# Patient Record
Sex: Female | Born: 1980 | Race: White | Hispanic: No | Marital: Single | Smoking: Former smoker
Health system: Southern US, Community
[De-identification: ages and names within clinical notes are randomized; demographics above are authoritative.]

## PROBLEM LIST (undated history)

## (undated) ENCOUNTER — Inpatient Hospital Stay: Payer: Self-pay

## (undated) DIAGNOSIS — K5900 Constipation, unspecified: Secondary | ICD-10-CM

## (undated) DIAGNOSIS — U071 COVID-19: Secondary | ICD-10-CM

## (undated) DIAGNOSIS — F419 Anxiety disorder, unspecified: Secondary | ICD-10-CM

## (undated) DIAGNOSIS — N83209 Unspecified ovarian cyst, unspecified side: Secondary | ICD-10-CM

## (undated) DIAGNOSIS — B019 Varicella without complication: Secondary | ICD-10-CM

## (undated) DIAGNOSIS — M21612 Bunion of left foot: Secondary | ICD-10-CM

## (undated) DIAGNOSIS — R87619 Unspecified abnormal cytological findings in specimens from cervix uteri: Secondary | ICD-10-CM

## (undated) DIAGNOSIS — A6000 Herpesviral infection of urogenital system, unspecified: Secondary | ICD-10-CM

## (undated) DIAGNOSIS — S73192A Other sprain of left hip, initial encounter: Secondary | ICD-10-CM

## (undated) DIAGNOSIS — F4310 Post-traumatic stress disorder, unspecified: Secondary | ICD-10-CM

## (undated) DIAGNOSIS — D369 Benign neoplasm, unspecified site: Secondary | ICD-10-CM

## (undated) HISTORY — DX: Constipation, unspecified: K59.00

## (undated) HISTORY — DX: Herpesviral infection of urogenital system, unspecified: A60.00

## (undated) HISTORY — DX: Benign neoplasm, unspecified site: D36.9

## (undated) HISTORY — DX: Bunion of left foot: M21.612

## (undated) HISTORY — DX: Other sprain of left hip, initial encounter: S73.192A

## (undated) HISTORY — DX: Anxiety disorder, unspecified: F41.9

## (undated) HISTORY — DX: COVID-19: U07.1

## (undated) HISTORY — DX: Unspecified abnormal cytological findings in specimens from cervix uteri: R87.619

## (undated) HISTORY — DX: Unspecified ovarian cyst, unspecified side: N83.209

## (undated) HISTORY — DX: Varicella without complication: B01.9

## (undated) HISTORY — DX: Post-traumatic stress disorder, unspecified: F43.10

---
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
## 2005-01-08 ENCOUNTER — Ambulatory Visit (HOSPITAL_COMMUNITY): Admission: RE | Admit: 2005-01-08 | Discharge: 2005-01-08 | Payer: Self-pay | Admitting: Gynecology

## 2005-05-29 ENCOUNTER — Inpatient Hospital Stay (HOSPITAL_COMMUNITY): Admission: AD | Admit: 2005-05-29 | Discharge: 2005-05-31 | Payer: Self-pay | Admitting: Gynecology

## 2007-11-06 ENCOUNTER — Ambulatory Visit: Payer: Self-pay

## 2008-04-23 ENCOUNTER — Emergency Department: Payer: Self-pay | Admitting: Emergency Medicine

## 2008-04-23 ENCOUNTER — Other Ambulatory Visit: Payer: Self-pay

## 2011-01-19 ENCOUNTER — Encounter: Payer: Self-pay | Admitting: Gynecology

## 2013-04-07 ENCOUNTER — Ambulatory Visit (INDEPENDENT_AMBULATORY_CARE_PROVIDER_SITE_OTHER): Payer: 59 | Admitting: Obstetrics & Gynecology

## 2013-04-07 ENCOUNTER — Encounter: Payer: Self-pay | Admitting: Obstetrics & Gynecology

## 2013-04-07 VITALS — BP 118/77 | HR 99 | Temp 98.2°F | Ht 66.0 in | Wt 140.0 lb

## 2013-04-07 DIAGNOSIS — Z3202 Encounter for pregnancy test, result negative: Secondary | ICD-10-CM

## 2013-04-07 DIAGNOSIS — N760 Acute vaginitis: Secondary | ICD-10-CM

## 2013-04-07 DIAGNOSIS — R87612 Low grade squamous intraepithelial lesion on cytologic smear of cervix (LGSIL): Secondary | ICD-10-CM | POA: Insufficient documentation

## 2013-04-07 LAB — POCT WET PREP (WET MOUNT)

## 2013-04-07 NOTE — Progress Notes (Signed)
Subjective:     Patient ID: Suzanne Evans, female   DOB: 1981/10/16, 32 y.o.   MRN: [id number]  HPI See colposcopy note  Review of Systems See colposcopy note    Objective:   Physical Exam  Genitourinary:         Assessment:     See colposcopy note    Plan:        See colposcopy note

## 2013-04-07 NOTE — Patient Instructions (Signed)

## 2013-04-07 NOTE — Progress Notes (Signed)
.  Colposcopy Procedure Note  Indications: Pap smear 3 months ago showed: low-grade squamous intraepithelial neoplasia (LGSIL - encompassing HPV,mild dysplasia,CIN I). The prior pap showed no abnormalities (09/2011 - per patient).  Prior cervical/vaginal disease: normal exam without visible pathology. Prior cervical treatment: no treatment. Patient had an IUD inserted 12/2012.  Procedure Details  The risks and benefits of the procedure and Written informed consent obtained.  A time-out was performed confirming the patient, procedure and allergy status  Speculum placed in vagina and excellent visualization of cervix achieved, cervix swabbed x 3 with acetic acid solution.  Findings: Cervix: acetowhite lesion(s) noted at 11 o'clock and punctation noted at 1 o'clock; hemostasis achieved with silver nitrate.       Specimens: Cervical biospsies/ECC  Complications: none.  Plan: Review biopsy result/likely repeat Pap w/HPV co-test in 1 yr

## 2013-04-08 ENCOUNTER — Encounter: Payer: Self-pay | Admitting: Obstetrics & Gynecology

## 2013-04-12 ENCOUNTER — Telehealth: Payer: Self-pay | Admitting: *Deleted

## 2013-04-12 NOTE — Telephone Encounter (Signed)
Patient called for results from Colposcopy that was done in office on 04/07/13.  Attempted to call back patient - voice mail not set up.

## 2013-05-04 ENCOUNTER — Encounter: Payer: Self-pay | Admitting: Obstetrics & Gynecology

## 2013-05-04 DIAGNOSIS — N870 Mild cervical dysplasia: Secondary | ICD-10-CM | POA: Insufficient documentation

## 2013-05-11 ENCOUNTER — Encounter: Payer: Self-pay | Admitting: Obstetrics & Gynecology

## 2013-06-03 ENCOUNTER — Ambulatory Visit (INDEPENDENT_AMBULATORY_CARE_PROVIDER_SITE_OTHER): Payer: 59 | Admitting: Obstetrics & Gynecology

## 2013-06-03 ENCOUNTER — Other Ambulatory Visit: Payer: Self-pay | Admitting: Obstetrics & Gynecology

## 2013-06-03 VITALS — BP 131/81 | HR 92 | Temp 99.5°F

## 2013-06-03 DIAGNOSIS — N926 Irregular menstruation, unspecified: Secondary | ICD-10-CM

## 2013-06-03 DIAGNOSIS — N939 Abnormal uterine and vaginal bleeding, unspecified: Secondary | ICD-10-CM

## 2013-06-03 DIAGNOSIS — N949 Unspecified condition associated with female genital organs and menstrual cycle: Secondary | ICD-10-CM

## 2013-06-03 LAB — POCT URINE PREGNANCY: Preg Test, Ur: NEGATIVE

## 2013-06-03 NOTE — Patient Instructions (Signed)
Pelvic Pain Pelvic pain is pain below the belly button and located between your hips. Acute pain may last a few hours or days. Chronic pelvic pain may last weeks and months. The cause may be different for different types of pain. The pain may be dull or sharp, mild or severe and can interfere with your daily activities. Write down and tell your caregiver:   Exactly where the pain is located.  If it comes and goes or is there all the time.  When it happens (with sex, urination, bowel movement, etc.)  If the pain is related to your menstrual period or stress. Your caregiver will take a full history and do a complete physical exam and Pap test. CAUSES   Painful menstrual periods (dysmenorrhea).  Normal ovulation (Mittelschmertz) that occurs in the middle of the menstrual cycle every month.  The pelvic organs get engorged with blood just before the menstrual period (pelvic congestive syndrome).  Scar tissue from an infection or past surgery (pelvic adhesions).  Cancer of the female pelvic organs. When there is pain with cancer, it has been there for a long time.  The lining of the uterus (endometrium) abnormally grows in places like the pelvis and on the pelvic organs (endometriosis).  A form of endometriosis with the lining of the uterus present inside of the muscle tissue of the uterus (adenomyosis).  Fibroid tumor (noncancerous) in the uterus.  Bladder problems such as infection, bladder spasms of the muscle tissue of the bladder.  Intestinal problems (irritable bowel syndrome, colitis, an ulcer or gastrointestinal infection).  Polyps of the cervix or uterus.  Pregnancy in the tube (ectopic pregnancy).  The opening of the cervix is too small for the menstrual blood to flow through it (cervical stenosis).  Physical or sexual abuse (past or present).  Musculo-skeletal problems from poor posture, problems with the vertebrae of the lower back or the uterine pelvic muscles falling  (prolapse).  Psychological problems such as depression or stress.  IUD (intrauterine device) in the uterus. DIAGNOSIS  Tests to make a diagnosis depends on the type, location, severity and what causes the pain to occur. Tests that may be needed include:  Blood tests.  Urine tests  Ultrasound.  X-rays.  CT Scan.  MRI.  Laparoscopy.  Major surgery. TREATMENT  Treatment will depend on the cause of the pain, which includes:  Prescription or over-the-counter pain medication.  Antibiotics.  Birth control pills.  Hormone treatment.  Nerve blocking injections.  Physical therapy.  Antidepressants.  Counseling with a psychiatrist or psychologist.  Minor or major surgery. HOME CARE INSTRUCTIONS   Only take over-the-counter or prescription medicines for pain, discomfort or fever as directed by your caregiver.  Follow your caregiver's advice to treat your pain.  Rest.  Avoid sexual intercourse if it causes the pain.  Apply warm or cold compresses (which ever works best) to the pain area.  Do relaxation exercises such as yoga or meditation.  Try acupuncture.  Avoid stressful situations.  Try group therapy.  If the pain is because of a stomach/intestinal upset, drink clear liquids, eat a bland light food diet until the symptoms go away. SEEK MEDICAL CARE IF:   You need stronger prescription pain medication.  You develop pain with sexual intercourse.  You have pain with urination.  You develop a temperature of 102 F (38.9 C) with the pain.  You are still in pain after 4 hours of taking prescription medication for the pain.  You need depression medication.    Your IUD is causing pain and you want it removed. SEEK IMMEDIATE MEDICAL CARE IF:  You develop very severe pain or tenderness.  You faint, have chills, severe weakness or dehydration.  You develop heavy vaginal bleeding or passing solid tissue.  You develop a temperature of 102 F (38.9 C)  with the pain.  You have blood in the urine.  You are being physically or sexually abused.  You have uncontrolled vomiting and diarrhea.  You are depressed and afraid of harming yourself or someone else. Document Released: 01/23/2005 Document Revised: 03/09/2012 Document Reviewed: 10/20/2008 [employer] Patient Information 2013 [employer].  

## 2013-06-03 NOTE — Progress Notes (Signed)
Subjective:     Suzanne Evans is a 32 y.o. female here for a routine exam.  Current complaints: bleeding for 2 weeks- IUD for almost 6 months. Pt is having pelvic pain.   Gynecologic History No LMP recorded. Patient is not currently having periods (Reason: IUD). Contraception: IUD Last Pap: 10/2013. Results were: abnormal   Obstetric History OB History   Grav Para Term Preterm Abortions TAB SAB Ect Mult Living   3 2   1  1   2      # Outc Date GA Lbr Len/2nd Wgt Sex Del Anes PTL Lv   1 PAR 5/06   8lb6oz(3.799kg) F SVD EPI  Yes   2 SAB 2009           3 PAR 7/10   8lb8oz(3.856kg) M SVD EPI  Yes       The following portions of the patient's history were reviewed and updated as appropriate: allergies, current medications, past family history, past medical history, past social history, past surgical history and problem list.  Review of Systems Pertinent items are noted in HPI.    Objective:    General appearance: alert Breasts: normal appearance, no masses or tenderness Abdomen: soft, non-tender; bowel sounds normal; no masses,  no organomegaly Pelvic: cervix normal in appearance, external genitalia normal, no adnexal masses or tenderness, uterus normal size, shape, and consistency and vagina normal without discharge   U/S by me OK--IUD in a good position  Assessment:   Unscheduled bleed--Mirena IUD ?Etiology of complaints  Plan:    Return in a few months

## 2013-06-04 LAB — WET PREP BY MOLECULAR PROBE
Candida species: NEGATIVE
Trichomonas vaginosis: NEGATIVE

## 2013-06-04 LAB — URINALYSIS
Glucose, UA: NEGATIVE mg/dL
Ketones, ur: NEGATIVE mg/dL
Leukocytes, UA: NEGATIVE
pH: 7.5 (ref 5.0–8.0)

## 2013-06-04 LAB — GC/CHLAMYDIA PROBE AMP: CT Probe RNA: NEGATIVE

## 2013-06-06 ENCOUNTER — Encounter: Payer: Self-pay | Admitting: Obstetrics & Gynecology

## 2013-06-06 DIAGNOSIS — N939 Abnormal uterine and vaginal bleeding, unspecified: Secondary | ICD-10-CM | POA: Insufficient documentation

## 2013-06-06 DIAGNOSIS — N949 Unspecified condition associated with female genital organs and menstrual cycle: Secondary | ICD-10-CM | POA: Insufficient documentation

## 2013-07-07 ENCOUNTER — Ambulatory Visit: Payer: 59 | Admitting: Obstetrics & Gynecology

## 2013-07-28 ENCOUNTER — Ambulatory Visit: Payer: 59 | Admitting: Obstetrics & Gynecology

## 2014-02-03 ENCOUNTER — Encounter: Payer: Self-pay | Admitting: Obstetrics & Gynecology

## 2014-02-03 ENCOUNTER — Ambulatory Visit (INDEPENDENT_AMBULATORY_CARE_PROVIDER_SITE_OTHER): Payer: 59 | Admitting: Obstetrics & Gynecology

## 2014-02-03 ENCOUNTER — Ambulatory Visit: Payer: Self-pay | Admitting: Obstetrics & Gynecology

## 2014-02-03 VITALS — BP 110/75 | HR 91 | Temp 98.9°F | Ht 65.0 in | Wt 132.4 lb

## 2014-02-03 DIAGNOSIS — N739 Female pelvic inflammatory disease, unspecified: Secondary | ICD-10-CM

## 2014-02-03 NOTE — Progress Notes (Deleted)
Subjective:    Suzanne Evans is a 33 y.o. female who presents with uterine fibroids. Periods are {gyn period regularity:715}, lasting {numbers; 0-10:33138} days. Dysmenorrhea:{gyn dysmenorrhea:716}. Cyclic symptoms include {sys gyn cyclic ZC:58850}. No intermenstrual bleeding, spotting, or discharge.  Current contraception: {contraceptive method:5051} History of abnormal Pap smear: {yes***/no:17258} Family history of uterine or ovarian cancer: {yes***/no:17258} Regular self breast exam: {yes/no:63} History of abnormal mammogram: {yes***/no:17258} Family history of breast cancer: {yes***/no:17258} History of abnormal lipids: {yes***/no:17258} Menstrual History: OB History   Grav Para Term Preterm Abortions TAB SAB Ect Mult Living   3 2   1  1   2       Menarche age: *** No LMP recorded. Patient is not currently having periods (Reason: IUD).    {Common ambulatory SmartLinks:19316}  Review of Systems {ros - complete:30496}    Objective:     BP 110/75  Pulse 91  Temp(Src) 98.9 F (37.2 C)  Ht 5\' 5"  (1.651 m)  Wt 132 lb 6.4 oz (60.056 kg)  BMI 22.03 kg/m2 {exam; complete normal and system select:17964}    Assessment:    Symptomatic uterine fibroids.    Plan:    {gyn plan:13146}

## 2014-02-03 NOTE — Patient Instructions (Addendum)
Avoid perfumes and dyes in vaginal area Warm soaks if needed   Disorders of the Vulva It is important to know the outside (external) area of the female genitalia to properly examine yourself. The vulva or labia, sometimes also called the lips around the vagina, covers the pubic bone and surrounds the vagina. The larger or outer labia is called the labia majora. The inner or smaller labia is called the labia minora. The clitoris is at the top of the vulva. It is covered by a small area of tissue called the hood. Below the clitoris is the opening of the tube from the bladder, which is the urethral opening. Just below the urethral opening is the opening of the vagina. This area is called the vestibule. Inside the vestibule are bartholin and skene glands that lubricate the outside of the vagina during sexual intercourse. The perineum is the area that lies between the vagina and anus. You should examine your external genitalia once a month just as you do your self breast examination. SIGNS AND SYMPTOMS TO LOOK FOR DURING YOUR EXAMINATION:  Swelling.  Redness.  Bumps.  Blisters.  Black or white areas.  Itching.  Bleeding.  Burning. TYPES OF VULVAR PROBLEMS  Infections.  Yeast (fungus) is the most common infection that causes redness, swelling, itching and a thick white vaginal discharge. Women with diabetes, taking medicines that kill germs (antibiotics) or on birth control pills are at risk for yeast infection. This infection is treated with vaginal creams, suppositories and anti-itch cream for the vulva.  Genital warts (condyloma) are caused by the human papilloma virus. They are raised bumps that can itch, bleed, cause discomfort and sometimes appear in groups like cauliflower. This is a sexually transmitted disease (STD) caused by sexual contact. This can be treated with topical medication, freezing, electrocautery, laser or surgical removal.  Genital herpes is a virus that causes  redness, swelling, blisters and ulcers that can cause itching and are very painful. This is also a STD. There are medications to control the symptoms of herpes, but there is no cure. Once you have it, you keep getting it because the virus stays in your body.  Contact dermatitis. Contact dermatitis is an irritation of the vulva that can cause redness, swelling and itching. It can be caused by:  Perfumed toilet paper.  Deodorants.  Talcum powder.  Hygiene sprays.  Tampons.  Soaps and fabric softeners.  Wearing wet underwear and bathing suits too long.  Spermicide.  Condoms.  Diaphragms.  Poison ivy. Treatment will depend on eliminating the cause and treating the symptoms.  Vulvodynia.  Vulvodynia is "painful vulva." It includes burning, itching, irritation and a feeling of rawness or bruising of the vulva. Treating this problem depends on the cause and diagnosis. Treatment can take a long time.  Vulvar Dystrophy.  Vulvar Dystrophy is a disorder of the skin of the vulva. The skin can be too thick with hard raised patches or too thin skin showing wrinkles. Vulvar dystrophy can cause redness or white patches, itching and burning sensation. A biopsy may be needed to diagnose the problem. Treatment with creams or ointments depends on the diagnosis.  Vulvar Cancer.  Vulvar cancer is usually a form of squamus skin cancer. Other types of vulvar cancer like melenoma (a dark or black, irregular shaped mole that can bleed easily) and adenocarcinoma (red, itchy, scaly area that looks like eczema) are rare. Treatment is usually surgery to remove the cancerous area, the vulva and the lymph nodes in  the groin. This can be done with or without radiation and chemotherapy. HOME CARE INSTRUCTIONS   Look at your vulva and external genital area every month.  Follow and finish all treatment given to you by your caregiver.  Do not use perfumed or scented soaps, detergents, hygiene spray, talcum  powder, tampons, douches or toilet paper.  Remove your tampon every 6 hours. Do not leave tampons in overnight.  Wear loose-fitting clothing.  Wear underwear that is cotton.  Avoid spermicidal creams or foams that irritate you. SEEK MEDICAL CARE IF:   You notice changes on your vulva such as redness, swelling, itching, color changes, bleeding, small bumps, blisters or any discomfort.  You develop an abnormal vaginal discharge.  You have painful sexual intercourse.  You notice swelling of the lymph nodes in your groin. Document Released: 06/03/2008 Document Revised: 03/09/2012 Document Reviewed: 03/18/2011 [facility] Patient Information 2014 [place], [place].

## 2014-02-03 NOTE — Progress Notes (Signed)
Subjective:     Suzanne Evans is a 33 y.o. female here for a routine exam.  Current complaints: Patient is in the office today c/o swollen lymphnode.Patient can't feel her IUD string and still c/o pelvic discomfort mostly in her lower back and mid abdomen. Patient has recently had some rectal bleeding and is currently using Colace.Patient does have pain with bowel movement x 3 weeks.   Personal health questionnaire reviewed:    Gynecologic History No LMP recorded. Patient is not currently having periods (Reason: IUD). Contraception: IUD    Obstetric History OB History  Gravida Para Term Preterm AB SAB TAB Ectopic Multiple Living  3 2   1 1    2     # Outcome Date GA Lbr Len/2nd Weight Sex Delivery Anes PTL Lv  3 PAR 07/21/09   8 lb 8 oz (3.856 kg) M SVD EPI  Y  2 SAB 2009          1 PAR 05/01/05   8 lb 6 oz (3.799 kg) F SVD EPI  Y      Review of Systems Pertinent items are noted in HPI.   Objective:   Pelvic: focal/right perineum skin erythema--mild/NT SPEC: IUD strings not seen Pelvic U/S: 3-D coronal views show appropriate IUD position  Assessment:   Vulvar pain/dermatitis--?etiology Plan:  HSV titers Counseled re: avoiding irritants/warm soaks Return in few months

## 2014-02-04 ENCOUNTER — Encounter: Payer: Self-pay | Admitting: Obstetrics & Gynecology

## 2014-02-04 LAB — HSV(HERPES SIMPLEX VRS) I + II AB-IGG
HSV 1 GLYCOPROTEIN G AB, IGG: 0.68 IV
HSV 2 GLYCOPROTEIN G AB, IGG: 9.82 IV — AB

## 2014-02-06 ENCOUNTER — Encounter: Payer: Self-pay | Admitting: Obstetrics & Gynecology

## 2014-02-06 DIAGNOSIS — A6000 Herpesviral infection of urogenital system, unspecified: Secondary | ICD-10-CM | POA: Insufficient documentation

## 2014-02-28 ENCOUNTER — Ambulatory Visit (INDEPENDENT_AMBULATORY_CARE_PROVIDER_SITE_OTHER): Payer: 59 | Admitting: Adult Health

## 2014-02-28 ENCOUNTER — Encounter: Payer: Self-pay | Admitting: Adult Health

## 2014-02-28 VITALS — BP 100/62 | HR 73 | Temp 98.4°F | Resp 14 | Ht 65.5 in | Wt 137.0 lb

## 2014-02-28 DIAGNOSIS — Z0000 Encounter for general adult medical examination without abnormal findings: Secondary | ICD-10-CM

## 2014-02-28 DIAGNOSIS — Z113 Encounter for screening for infections with a predominantly sexual mode of transmission: Secondary | ICD-10-CM

## 2014-02-28 DIAGNOSIS — F4321 Adjustment disorder with depressed mood: Secondary | ICD-10-CM

## 2014-02-28 MED ORDER — VENLAFAXINE HCL ER 37.5 MG PO CP24: 37.5000 mg | ORAL_CAPSULE | Freq: Every day | ORAL | Status: DC

## 2014-02-28 NOTE — Progress Notes (Signed)
Pre visit review using our clinic review tool, if applicable. No additional management support is needed unless otherwise documented below in the visit note. 

## 2014-02-28 NOTE — Patient Instructions (Signed)
   Thank you for choosing [place] [facility] for your health care needs.  Please have your labs drawn at your earliest convenience.  The results will be available through MyChart for your convenience. Please remember to activate this. The activation code is located at the end of this form.  Medications have been sent to [facility] on [address]. (Effexor 37.5 mg).

## 2014-02-28 NOTE — Progress Notes (Signed)
Subjective:    Patient ID: Suzanne Evans, female    DOB: [birth year], 33 y.o.   MRN: [id number]  HPI  Pt is a pleasant 33 y/o female who presents to clinic to establish care. She is feeling well overall. She has been experiencing episodes of crying as well as having no energy. She normally exercise; however, recently she has no desire to exercise. She has had a hx of depression in the past and feels this is an acute episode of same. She has been experiencing personal issues as well as problems at work. She is divorced and reports being tested for herpes shortly following her separation. She would like to be tested for HIV and syphilis which was not part of the previous work up. 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    Past Medical History  Diagnosis Date  . Constipation   . Chicken pox      Past Surgical History  Procedure Laterality Date  . Appendectomy  2003     Family History  Problem Relation Age of Onset  . Heart disease Maternal Grandmother   . Hyperlipidemia Maternal Grandmother   . Stroke Maternal Grandmother   . Hyperlipidemia Mother   . Obesity Mother   . Alcohol abuse Paternal Grandfather   . Obesity Father   . Obesity Sister   . Obesity Brother   . Cancer Maternal Grandfather     lung cancer - smoker     History   Social History  . Marital Status: Divorced    Spouse Name: N/A    Number of Children: 2  . Years of Education: 18   Occupational History  . Nurse Practitioner     [employer] Topics  . Smoking status: Never Smoker   . Smokeless tobacco: Not on file  . Alcohol Use: 1.0 oz/week    2 drink(s) per week  . Drug Use: No  . Sexual Activity: Yes    Partners: Male    Birth Control/ Protection: IUD   Other Topics Concern  . Not on file   Social History Narrative   Suzanne Evans grew up in [place]. She is presently living in [place] with  her children Suzanne Evans age 46 and 2 age 45). Suzanne Evans is currently divorced. She attended [employer] for her Bachelors in Nursing. She then attended [employer] for her Masters in Adult Oncology Nurse Practitioner. She is working at [employer]. She spends all of her spare time with her children and new puppy.      Caffeine - 2 cups of coffee daily then water   Exercise -      Review of Systems  Constitutional: Negative.   HENT: Negative.   Eyes: Negative.   Respiratory: Negative.   Cardiovascular: Negative.   Gastrointestinal: Negative.   Endocrine: Negative.   Genitourinary: Negative.   Musculoskeletal: Negative.   Skin: Negative.   Allergic/Immunologic: Negative.   Neurological: Negative.   Hematological: Negative.   Psychiatric/Behavioral: Positive for sleep disturbance. Negative for suicidal ideas, behavioral problems, confusion and agitation. The patient is nervous/anxious (depression).        Objective:   Physical Exam  Constitutional: She is oriented to person, place, and time. She appears well-developed and well-nourished. No distress.  HENT:  Head: Normocephalic and atraumatic.  Right Ear: External ear normal.  Left Ear: External ear normal.  Nose: Nose  normal.  Mouth/Throat: Oropharynx is clear and moist.  Eyes: Conjunctivae and EOM are normal. Pupils are equal, round, and reactive to light.  Neck: Normal range of motion. Neck supple. No tracheal deviation present.  Cardiovascular: Normal rate, regular rhythm, normal heart sounds and intact distal pulses.  Exam reveals no gallop and no friction rub.   No murmur heard. Pulmonary/Chest: Effort normal and breath sounds normal. No respiratory distress. She has no wheezes. She has no rales.  Abdominal: Soft.  Musculoskeletal: Normal range of motion. She exhibits no edema and no tenderness.  Neurological: She is alert and oriented to person, place, and time. She has normal reflexes. Coordination normal.    Skin: Skin is warm and dry.  Psychiatric: She has a normal mood and affect. Her behavior is normal. Judgment and thought content normal.        Assessment & Plan:    1. Routine general medical examination at a health care facility Normal physical exam excluding breast, pelvic/pap which has recently be done within guidelines. Will check routine labs.  - TSH; Future - CBC with Differential; Future - Lipid panel; Future - Comprehensive metabolic panel; Future - Vit D  25 hydroxy (rtn osteoporosis monitoring); Future - Vitamin B12; Future  2. Situational depression Experiencing difficulties in personal and professional life. Will start Effexor 37.5 mg daily. RTC in 4-6 weeks. Adjust dose if necessary.  3. Screen for STD (sexually transmitted disease) Has been tested for herpes (positive for genital herpes without outbreaks). She reports exposure to Hep C in the past. Check RPR, Hep C and HIV.  - HIV antibody - RPR - Hepatitis C antibody

## 2014-03-01 ENCOUNTER — Other Ambulatory Visit: Payer: Self-pay | Admitting: *Deleted

## 2014-03-01 DIAGNOSIS — A6000 Herpesviral infection of urogenital system, unspecified: Secondary | ICD-10-CM

## 2014-03-01 MED ORDER — VALACYCLOVIR HCL 500 MG PO TABS: 500.0000 mg | ORAL_TABLET | Freq: Every day | ORAL | Status: DC

## 2014-03-03 ENCOUNTER — Other Ambulatory Visit (INDEPENDENT_AMBULATORY_CARE_PROVIDER_SITE_OTHER): Payer: 59

## 2014-03-03 DIAGNOSIS — F4321 Adjustment disorder with depressed mood: Secondary | ICD-10-CM | POA: Insufficient documentation

## 2014-03-03 DIAGNOSIS — Z113 Encounter for screening for infections with a predominantly sexual mode of transmission: Secondary | ICD-10-CM

## 2014-03-03 DIAGNOSIS — Z0000 Encounter for general adult medical examination without abnormal findings: Secondary | ICD-10-CM

## 2014-03-03 HISTORY — DX: Encounter for general adult medical examination without abnormal findings: Z00.00

## 2014-03-03 LAB — COMPREHENSIVE METABOLIC PANEL
ALK PHOS: 39 U/L (ref 39–117)
ALT: 12 U/L (ref 0–35)
AST: 15 U/L (ref 0–37)
Albumin: 4.4 g/dL (ref 3.5–5.2)
BILIRUBIN TOTAL: 1.1 mg/dL (ref 0.3–1.2)
BUN: 14 mg/dL (ref 6–23)
CO2: 25 mEq/L (ref 19–32)
CREATININE: 0.7 mg/dL (ref 0.4–1.2)
Calcium: 9.2 mg/dL (ref 8.4–10.5)
Chloride: 102 mEq/L (ref 96–112)
GFR: 97.59 mL/min (ref >60.00)
GLUCOSE: 84 mg/dL (ref 70–99)
POTASSIUM: 3.6 meq/L (ref 3.5–5.1)
Sodium: 135 mEq/L (ref 135–145)
Total Protein: 7.2 g/dL (ref 6.0–8.3)

## 2014-03-03 LAB — CBC WITH DIFFERENTIAL/PLATELET
Basophils Absolute: 0 10*3/uL (ref 0.0–0.1)
Basophils Relative: 0.4 % (ref 0.0–3.0)
EOS PCT: 2.9 % (ref 0.0–5.0)
Eosinophils Absolute: 0.2 10*3/uL (ref 0.0–0.7)
HEMATOCRIT: 39.5 % (ref 36.0–46.0)
Hemoglobin: 13.2 g/dL (ref 12.0–15.0)
LYMPHS ABS: 1.5 10*3/uL (ref 0.7–4.0)
LYMPHS PCT: 24.5 % (ref 12.0–46.0)
MCHC: 33.4 g/dL (ref 30.0–36.0)
MCV: 92.9 fl (ref 78.0–100.0)
MONOS PCT: 6.9 % (ref 3.0–12.0)
Monocytes Absolute: 0.4 10*3/uL (ref 0.1–1.0)
Neutro Abs: 4 10*3/uL (ref 1.4–7.7)
Neutrophils Relative %: 65.3 % (ref 43.0–77.0)
PLATELETS: 291 10*3/uL (ref 150.0–400.0)
RBC: 4.25 Mil/uL (ref 3.87–5.11)
RDW: 13.1 % (ref 11.5–14.6)
WBC: 6.2 10*3/uL (ref 4.5–10.5)

## 2014-03-03 LAB — VITAMIN B12: VITAMIN B 12: 258 pg/mL (ref 211–911)

## 2014-03-03 LAB — LIPID PANEL
CHOLESTEROL: 148 mg/dL (ref 0–200)
HDL: 48.5 mg/dL (ref >39.00)
LDL CALC: 92 mg/dL (ref 0–99)
TRIGLYCERIDES: 38 mg/dL (ref 0.0–149.0)
Total CHOL/HDL Ratio: 3
VLDL: 7.6 mg/dL (ref 0.0–40.0)

## 2014-03-03 LAB — TSH: TSH: 1.51 u[IU]/mL (ref 0.35–5.50)

## 2014-03-04 ENCOUNTER — Encounter: Payer: Self-pay | Admitting: *Deleted

## 2014-03-04 LAB — HEPATITIS C ANTIBODY: HCV Ab: NEGATIVE

## 2014-03-04 LAB — HIV ANTIBODY (ROUTINE TESTING W REFLEX): HIV: NONREACTIVE

## 2014-03-04 LAB — RPR

## 2014-03-04 LAB — VITAMIN D 25 HYDROXY (VIT D DEFICIENCY, FRACTURES): VIT D 25 HYDROXY: 40 ng/mL (ref 30–89)

## 2014-03-22 ENCOUNTER — Telehealth: Payer: Self-pay | Admitting: Adult Health

## 2014-03-22 ENCOUNTER — Other Ambulatory Visit: Payer: Self-pay | Admitting: Adult Health

## 2014-03-22 MED ORDER — VENLAFAXINE HCL ER 75 MG PO CP24: 75.0000 mg | ORAL_CAPSULE | Freq: Every day | ORAL | Status: DC

## 2014-03-22 NOTE — Progress Notes (Signed)
Left message on cell phone vm notifying pt

## 2014-03-22 NOTE — Telephone Encounter (Signed)
Pt states she was started on Effexor 37.5 mg.  Pt states that was not working for her so she doubled it.  States this works much better.  Asking if Raquel could call her in the 75 mg dose.  CHANGE:  [facility].

## 2014-03-23 ENCOUNTER — Ambulatory Visit (INDEPENDENT_AMBULATORY_CARE_PROVIDER_SITE_OTHER): Payer: 59 | Admitting: Adult Health

## 2014-03-23 ENCOUNTER — Encounter: Payer: Self-pay | Admitting: Adult Health

## 2014-03-23 VITALS — BP 100/66 | HR 85 | Temp 98.5°F | Resp 14 | Wt 130.0 lb

## 2014-03-23 DIAGNOSIS — R1013 Epigastric pain: Secondary | ICD-10-CM | POA: Insufficient documentation

## 2014-03-23 DIAGNOSIS — R1310 Dysphagia, unspecified: Secondary | ICD-10-CM | POA: Insufficient documentation

## 2014-03-23 HISTORY — DX: Epigastric pain: R10.13

## 2014-03-23 HISTORY — DX: Dysphagia, unspecified: R13.10

## 2014-03-23 MED ORDER — OMEPRAZOLE 40 MG PO CPDR: 40.0000 mg | DELAYED_RELEASE_CAPSULE | Freq: Every day | ORAL | Status: DC

## 2014-03-23 NOTE — Patient Instructions (Signed)
  I am referring you to GI.  The office will contact you with an appointment. If you have not heard anything by Friday morning please call the office and ask to speak to [employer].  Zantac 150 mg twice a day  Prilosec 40 mg daily

## 2014-03-23 NOTE — Progress Notes (Signed)
Patient ID: Suzanne Evans, female   DOB: [birth year], 33 y.o.   MRN: [id number]   Subjective:    Patient ID: Suzanne Evans, female    DOB: 04-09-1981, 33 y.o.   MRN: [id number]  HPI  Suzanne Evans is a pleasant 33 y/o female who presents to clinic with reports of esophageal pain with swallowing. She reports that symptoms began on Monday. She was previously on prilosec but had stopped taking medication when she began to feel improvement of her symptoms. Currently, she is under considerable amount of stress at work and with her personal life. She began taking prilosec 40 mg on Monday and also zantac 75 mg daily.    Past Medical History  Diagnosis Date  . Constipation   . Chicken pox     Current Outpatient Prescriptions on File Prior to Visit  Medication Sig Dispense Refill  . docusate sodium (COLACE) 100 MG capsule Take 100 mg by mouth 2 (two) times daily.      Marland Kitchen levonorgestrel (MIRENA) 20 MCG/24HR IUD 1 each by Intrauterine route once.      . valACYclovir (VALTREX) 500 MG tablet Take 1 tablet (500 mg total) by mouth daily.  30 tablet  11  . venlafaxine XR (EFFEXOR-XR) 75 MG 24 hr capsule Take 1 capsule (75 mg total) by mouth daily with breakfast.  30 capsule  6   No current facility-administered medications on file prior to visit.     Review of Systems  Gastrointestinal: Negative for nausea, vomiting, diarrhea, constipation, blood in stool, abdominal distention and anal bleeding. Abdominal pain: epigastric burning.  Psychiatric/Behavioral: The patient is nervous/anxious (improved symptoms since starting Effexor).        Increased stress at work  All other systems reviewed and are negative.       Objective:  BP 100/66  Pulse 85  Temp(Src) 98.5 F (36.9 C) (Oral)  Wt 130 lb (58.968 kg)  SpO2 97%   Physical Exam  Constitutional: She is oriented to person, place, and time. She appears well-developed and well-nourished. No distress.  Cardiovascular: Normal rate and regular  rhythm.   Pulmonary/Chest: Effort normal. No respiratory distress.  Abdominal: Soft. She exhibits no distension and no mass. There is no tenderness. There is no rebound and no guarding.  Hyperactive bowel sounds  Musculoskeletal: Normal range of motion.  Neurological: She is alert and oriented to person, place, and time. Coordination normal.  Psychiatric: She has a normal mood and affect. Her behavior is normal. Judgment and thought content normal.       Assessment & Plan:   1. Pain with swallowing Pain reported at mid esophagus. ?esophageal spasms, GERD, irritation of esophagus. Continue prilosec 40 mg daily. Increase zantac to 150 mg bid. Refer to GI for evaluation and determination if need for endoscopy.  2. Abdominal pain, epigastric Reports burning in epigastric area. Prilosec and zantac as above. Refer to GI - Ambulatory referral to Gastroenterology

## 2014-03-23 NOTE — Progress Notes (Signed)
Pre visit review using our clinic review tool, if applicable. No additional management support is needed unless otherwise documented below in the visit note. 

## 2014-03-24 ENCOUNTER — Encounter: Payer: Self-pay | Admitting: Gastroenterology

## 2014-03-25 ENCOUNTER — Encounter: Payer: Self-pay | Admitting: Emergency Medicine

## 2014-04-14 ENCOUNTER — Encounter: Payer: Self-pay | Admitting: Obstetrics & Gynecology

## 2014-04-14 ENCOUNTER — Ambulatory Visit (INDEPENDENT_AMBULATORY_CARE_PROVIDER_SITE_OTHER): Payer: 59 | Admitting: Obstetrics & Gynecology

## 2014-04-14 VITALS — BP 114/72 | HR 93 | Temp 99.6°F | Ht 66.0 in | Wt 130.0 lb

## 2014-04-14 DIAGNOSIS — R87612 Low grade squamous intraepithelial lesion on cytologic smear of cervix (LGSIL): Secondary | ICD-10-CM

## 2014-04-14 NOTE — Progress Notes (Signed)
Subjective:     Suzanne Evans is a 33 y.o. female here for a repeat pap smear.     The HPI was reviewed and explored in further detail by the provider. Gynecologic History No LMP recorded. Patient is not currently having periods (Reason: IUD). Contraception: IUD Last Pap: 12/2012. Results were: LGSIL Last mammogram: n/a  Obstetric History OB History  Gravida Para Term Preterm AB SAB TAB Ectopic Multiple Living  3 2   1 1    2     # Outcome Date GA Lbr Len/2nd Weight Sex Delivery Anes PTL Lv  3 PAR 07/21/09   8 lb 8 oz (3.856 kg) M SVD EPI  Y  2 SAB 2009          1 PAR 05/01/05   8 lb 6 oz (3.799 kg) F SVD EPI  Y      Past Medical History  Diagnosis Date  . Constipation   . Chicken pox     Past Surgical History  Procedure Laterality Date  . Appendectomy  2003     (Not in a hospital admission) Allergies  Allergen Reactions  . Sulfa Antibiotics Anaphylaxis    History  Substance Use Topics  . Smoking status: Never Smoker   . Smokeless tobacco: Not on file  . Alcohol Use: 1.0 oz/week    2 drink(s) per week    Family History  Problem Relation Age of Onset  . Heart disease Maternal Grandmother   . Hyperlipidemia Maternal Grandmother   . Stroke Maternal Grandmother   . Hyperlipidemia Mother   . Obesity Mother   . Alcohol abuse Paternal Grandfather   . Obesity Father   . Obesity Sister   . Obesity Brother   . Cancer Maternal Grandfather     lung cancer - smoker      Review of Systems  Constitutional: negative for fatigue and weight loss Respiratory: negative for cough and wheezing Cardiovascular: negative for chest pain, fatigue and palpitations Gastrointestinal: negative for abdominal pain and change in bowel habits Musculoskeletal:negative for myalgias Neurological: negative for gait problems and tremors Behavioral/Psych: negative for abusive relationship, depression Endocrine: negative for temperature intolerance   Genitourinary:negative for  abnormal menstrual periods, genital lesions, hot flashes, sexual problems and vaginal discharge Integument/breast: negative for breast lump, breast tenderness, nipple discharge and skin lesion(s)    Objective:       General:   alert  Skin:   no rash or abnormalities  Lungs:   clear to auscultation bilaterally  Heart:   regular rate and rhythm, S1, S2 normal, no murmur, click, rub or gallop  Breasts:   normal without suspicious masses, skin or nipple changes or axillary nodes  Abdomen:  normal findings: no organomegaly, soft, non-tender and no hernia  Pelvis:  External genitalia: normal general appearance Urinary system: urethral meatus normal and bladder without fullness, nontender Vaginal: normal without tenderness, induration or masses Cervix: normal appearance Adnexa: normal bimanual exam Uterus: anteverted and non-tender, normal size   Lab Review  Labs reviewed yes Radiologic studies reviewed yes     Assessment:   CIN I  Plan:   Pap w/HPV co-test; management to be determined by the results Follow up as needed.

## 2014-04-18 LAB — PAP IG W/ RFLX HPV ASCU

## 2014-05-11 ENCOUNTER — Ambulatory Visit (INDEPENDENT_AMBULATORY_CARE_PROVIDER_SITE_OTHER): Payer: 59 | Admitting: Gastroenterology

## 2014-05-11 ENCOUNTER — Encounter: Payer: Self-pay | Admitting: Gastroenterology

## 2014-05-11 VITALS — BP 104/58 | HR 76 | Ht 65.5 in | Wt 133.0 lb

## 2014-05-11 DIAGNOSIS — K219 Gastro-esophageal reflux disease without esophagitis: Secondary | ICD-10-CM

## 2014-05-11 DIAGNOSIS — K5900 Constipation, unspecified: Secondary | ICD-10-CM

## 2014-05-11 DIAGNOSIS — R1013 Epigastric pain: Secondary | ICD-10-CM

## 2014-05-11 MED ORDER — SOD PICOSULFATE-MAG OX-CIT ACD 10-3.5-12 MG-GM-GM PO PACK: 1.0000 | PACK | ORAL | Status: DC

## 2014-05-11 NOTE — Patient Instructions (Signed)
You have been scheduled for an endoscopy and flexible sigmoidoscopy. Please follow the written instructions given to you at your visit today. If you use inhalers (even only as needed), please bring them with you on the day of your procedure. Your physician has requested that you go to www.startemmi.com and enter the access code given to you at your visit today. This web site gives a general overview about your procedure. However, you should still follow specific instructions given to you by our office regarding your preparation for the procedure.  Patient advised to avoid spicy, acidic, citrus, chocolate, mints, fruit and fruit juices.  Limit the intake of caffeine, alcohol and Soda.  Don't exercise too soon after eating.  Don't lie down within 3-4 hours of eating.  Elevate the head of your bed.  Thank you for choosing me and [facility].  Pricilla Riffle. Dagoberto Ligas., MD., [facility]

## 2014-05-11 NOTE — Progress Notes (Signed)
    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 She relates the onset of mid substernal chest pain with swallowing several weeks ago. She relates a history of GERD in the past and has been treated with PPIs previously. She states after losing weight and adjusting her diet her reflux symptoms went away. 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 For the past few years she has noted intermittent epigastric pain following meals. She notes the onset of constipation for about the past 6 months and occasionally notes small amounts of bright red blood with hard stools. Her constipation has improved with Colace. She states she was evaluated at [facility] in 2007. She relates the celiac testing and H. pylori antibody testing were negative. Denies weight loss, abdominal pain, constipation, diarrhea, change in stool caliber, melena, hematochezia, nausea, vomiting, dysphagia, reflux symptoms, chest pain.  tive review of systems were noted in the above HPI section. All other review of systems were otherwise negative.  Current Medications, Allergies, Past Medical History, Past Surgical History, Family History and Social History were reviewed in [employer] record.  Physical Exam: General: Well developed , well nourished, no acute distress Head: Normocephalic and atraumatic Eyes:  sclerae anicteric, EOMI Ears: Normal auditory acuity Mouth: No deformity or lesions Neck: Supple, no masses or thyromegaly Lungs: Clear throughout to auscultation Heart: Regular rate and rhythm; no murmurs, rubs or bruits Abdomen: Soft, minimal epigastric tenderness to deep palpation and non distended. No masses, hepatosplenomegaly or hernias noted. Normal Bowel sounds Rectal: no lesions, no tenderness, trace heme pos  stool Musculoskeletal: Symmetrical with no gross deformities  Skin: No lesions on visible extremities Pulses:  Normal pulses noted Extremities: No clubbing, cyanosis, edema or deformities noted Neurological: Alert oriented x 4, grossly nonfocal Cervical Nodes:  No significant cervical adenopathy Inguinal Nodes: No significant inguinal adenopathy Psychological:  Alert and cooperative. Normal mood and affect  Assessment and Recommendations:  1. Odynophagia and mild intermittent epigastric pain. R/O esophagitis, GERD. Continue omeprazole 40 mg daily and standard antireflux measures. Schedule EGD. The risks, benefits, and alternatives to endoscopy with possible biopsy and possible dilation were discussed with the patient and they consent to proceed. If her postprandial epigastric pain and no cause is found at endoscopy, consider abdominal ultrasound to rule out cholelithiasis.  2. Hematochezia and heme pos stool. Presumed hemorrhoids. The risks, benefits, and alternatives to colonoscopy or flexible sigmoidoscopy with possible biopsy and possible polypectomy were discussed with the patient and they consent to proceed with flexible sigmoidoscopy.   3. Constipation. High fiber diet, increase water intake and continue Colace as needed.

## 2014-05-25 ENCOUNTER — Encounter: Payer: Self-pay | Admitting: Gastroenterology

## 2014-05-25 ENCOUNTER — Ambulatory Visit (AMBULATORY_SURGERY_CENTER): Payer: 59 | Admitting: Gastroenterology

## 2014-05-25 VITALS — BP 102/46 | HR 62 | Temp 97.2°F | Resp 15 | Ht 65.0 in | Wt 133.0 lb

## 2014-05-25 DIAGNOSIS — K2100 Gastro-esophageal reflux disease with esophagitis, without bleeding: Secondary | ICD-10-CM

## 2014-05-25 DIAGNOSIS — D126 Benign neoplasm of colon, unspecified: Secondary | ICD-10-CM

## 2014-05-25 DIAGNOSIS — K921 Melena: Secondary | ICD-10-CM

## 2014-05-25 DIAGNOSIS — R195 Other fecal abnormalities: Secondary | ICD-10-CM

## 2014-05-25 DIAGNOSIS — K5900 Constipation, unspecified: Secondary | ICD-10-CM

## 2014-05-25 DIAGNOSIS — K219 Gastro-esophageal reflux disease without esophagitis: Secondary | ICD-10-CM

## 2014-05-25 DIAGNOSIS — R1310 Dysphagia, unspecified: Secondary | ICD-10-CM

## 2014-05-25 MED ORDER — SODIUM CHLORIDE 0.9 % IV SOLN: 500.0000 mL | INTRAVENOUS | Status: DC

## 2014-05-25 NOTE — Progress Notes (Signed)
Called to room to assist during endoscopic procedure.  Patient ID and intended procedure confirmed with present staff. Received instructions for my participation in the procedure from the performing physician.  

## 2014-05-25 NOTE — Op Note (Signed)
[place]  [address] [place], [id number]   ENDOSCOPY PROCEDURE REPORT  PATIENT: Suzanne Evans, Suzanne Evans  MR#: [id number] BIRTHDATE: 10-20-1981 , 32  yrs. old GENDER: Female ENDOSCOPIST: Ladene Artist, MD, [facility] REFERRED BY:  Charolette Forward, M.D. PROCEDURE DATE:  05/25/2014 PROCEDURE:  EGD w/ biopsy ASA CLASS:     Class II INDICATIONS:  History of esophageal reflux.   Heme positive stool. Odynophagia. MEDICATIONS: MAC sedation, administered by CRNA and propofol (Diprivan) 190mg  IV TOPICAL ANESTHETIC: Cetacaine Spray DESCRIPTION OF PROCEDURE: After the risks benefits and alternatives of the procedure were thoroughly explained, informed consent was obtained.  The LB [id number] endoscope was introduced through the mouth and advanced to the second portion of the duodenum. Without limitations.  The instrument was slowly withdrawn as the mucosa was fully examined.  ESOPHAGUS: A variable Z-line was observed 38 cm from the incisors. Multiple biopsies were performed.   The esophagus was otherwise normal. STOMACH: The mucosa and folds of the stomach appeared normal. DUODENUM: The duodenal mucosa showed no abnormalities in the bulb and second portion of the duodenum.  Retroflexed views revealed no abnormalities.   The scope was then withdrawn from the patient and the procedure completed.  COMPLICATIONS: There were no complications.  ENDOSCOPIC IMPRESSION: 1.   Variable Z-line; multiple biopsies 2.   The EGD was otherwise normal.  RECOMMENDATIONS: 1.  Await pathology results 2.  Anti-reflux regimen 3.  Continue PPI   eSigned:  Ladene Artist, MD, [facility] 05/25/2014 9:11 AM

## 2014-05-25 NOTE — Op Note (Addendum)
[place]  [address]. [place], [id number]   FLEXIBLE SIGMOIDOSCOPY PROCEDURE REPORT  PATIENT: Suzanne Evans, Suzanne Evans  MR#: [id number] BIRTHDATE: 12/26/1981 , 32  yrs. old GENDER: Female ENDOSCOPIST: Ladene Artist, MD, [facility] REFERRED BY: Charolette Forward, M.D. PROCEDURE DATE:  05/25/2014 PROCEDURE:   Sigmoidoscopy with snare polypectomy ASA CLASS:   Class II INDICATIONS:hematochezia.   heme-positive stool.   constipation. MEDICATIONS: There was residual sedation effect present from prior procedure, MAC sedation, administered by CRNA, and propofol (Diprivan) 140mg  IV DESCRIPTION OF PROCEDURE:   After the risks benefits and alternatives of the procedure were thoroughly explained, informed consent was obtained.  revealed no abnormalities of the rectum. The LB [id number]  endoscope was introduced through the anus and advanced to the splenic flexure. No adverse events experienced. The quality of the prep was good .  The instrument was then slowly withdrawn as the mucosa was fully examined.  COLON FINDINGS: A smooth sessile polyp measuring 7 mm in size was found in the sigmoid colon.  A polypectomy was performed using snare cautery. The resection was complete and the polyp tissue was completely retrieved. The rest of the sigmoid colon appeared normal. The colonic mucosa appeared normal at the splenic flexure, in the descending colon, and rectum.  Retroflexed views revealed a small internal hemorrhoid.  The scope was then withdrawn from the patient and the procedure terminated.  COMPLICATIONS: There were no complications.  ENDOSCOPIC IMPRESSION: 1.   Sessile polyp measuring 7 mm in the sigmoid colon; polypectomy performed using snare cautery 2.   Small internal hemorrhoids  RECOMMENDATIONS: 1.  await pathology results 2.  If polyp is precancerous then colonoscopy to evaluate for polyps in the proximal colon. If polyp is not precancerous then routine screening  colonoscopy for average risk patient at age 73.    eSigned:  Ladene Artist, MD, [facility] 05/25/2014 9:40 AM Revised: 05/25/2014 9:40 AM

## 2014-05-25 NOTE — Patient Instructions (Signed)
YOU HAD AN ENDOSCOPIC PROCEDURE TODAY AT THE [facility]: Refer to the procedure report that was given to you for any specific questions about what was found during the examination.  If the procedure report does not answer your questions, please call your gastroenterologist to clarify.  If you requested that your care partner not be given the details of your procedure findings, then the procedure report has been included in a sealed envelope for you to review at your convenience later.  YOU SHOULD EXPECT: Some feelings of bloating in the abdomen. Passage of more gas than usual.  Walking can help get rid of the air that was put into your GI tract during the procedure and reduce the bloating. If you had a lower endoscopy (such as a colonoscopy or flexible sigmoidoscopy) you may notice spotting of blood in your stool or on the toilet paper. If you underwent a bowel prep for your procedure, then you may not have a normal bowel movement for a few days.  DIET: Your first meal following the procedure should be a light meal and then it is ok to progress to your normal diet.  A half-sandwich or bowl of soup is an example of a good first meal.  Heavy or fried foods are harder to digest and may make you feel nauseous or bloated.  Likewise meals heavy in dairy and vegetables can cause extra gas to form and this can also increase the bloating.  Drink plenty of fluids but you should avoid alcoholic beverages for 24 hours.  ACTIVITY: Your care partner should take you home directly after the procedure.  You should plan to take it easy, moving slowly for the rest of the day.  You can resume normal activity the day after the procedure however you should NOT DRIVE or use heavy machinery for 24 hours (because of the sedation medicines used during the test).    SYMPTOMS TO REPORT IMMEDIATELY: A gastroenterologist can be reached at any hour.  During normal business hours, 8:30 AM to 5:00 PM Monday through Friday,  call [phone].  After hours and on weekends, please call the GI answering service at [phone] who will take a message and have the physician on call contact you.   Following lower endoscopy (colonoscopy or flexible sigmoidoscopy):  Excessive amounts of blood in the stool  Significant tenderness or worsening of abdominal pains  Swelling of the abdomen that is new, acute  Fever of 100F or higher  Following upper endoscopy (EGD)  Vomiting of blood or coffee ground material  New chest pain or pain under the shoulder blades  Painful or persistently difficult swallowing  New shortness of breath  Fever of 100F or higher  Black, tarry-looking stools  FOLLOW UP: If any biopsies were taken you will be contacted by phone or by letter within the next 1-3 weeks.  Call your gastroenterologist if you have not heard about the biopsies in 3 weeks.  Our staff will call the home number listed on your records the next business day following your procedure to check on you and address any questions or concerns that you may have at that time regarding the information given to you following your procedure. This is a courtesy call and so if there is no answer at the home number and we have not heard from you through the emergency physician on call, we will assume that you have returned to your regular daily activities without incident.  SIGNATURES/CONFIDENTIALITY: You and/or your care   partner have signed paperwork which will be entered into your electronic medical record.  These signatures attest to the fact that that the information above on your After Visit Summary has been reviewed and is understood.  Full responsibility of the confidentiality of this discharge information lies with you and/or your care-partner.  

## 2014-05-25 NOTE — Progress Notes (Signed)
Report to PACU, RN, vss, BBS= Clear.  

## 2014-05-26 ENCOUNTER — Telehealth: Payer: Self-pay | Admitting: *Deleted

## 2014-05-26 NOTE — Telephone Encounter (Signed)
  Follow up Call-  Call back number 05/25/2014  Post procedure Call Back phone  # [phone]  Permission to leave phone message Yes     Patient questions:  Do you have a fever, pain , or abdominal swelling? no Pain Score  0 *  Have you tolerated food without any problems? yes  Have you been able to return to your normal activities? yes  Do you have any questions about your discharge instructions: Diet   no Medications  no Follow up visit  no  Do you have questions or concerns about your Care? no  Actions: * If pain score is 4 or above: No action needed, pain <4.

## 2014-05-31 ENCOUNTER — Encounter: Payer: Self-pay | Admitting: Gastroenterology

## 2014-10-12 ENCOUNTER — Telehealth: Payer: Self-pay

## 2014-10-12 NOTE — Telephone Encounter (Signed)
Called back, no answer, no VM

## 2014-10-12 NOTE — Telephone Encounter (Signed)
Spoke with pt, scheduled OV 10.15.15 at 2:30 w/Dr Gilford Rile

## 2014-10-12 NOTE — Telephone Encounter (Signed)
The patient called wanting to know if she could take her Valtrex with the facial numbness she is experiencing.  She was offered an appointment at a different location for tomorrow, however, she refused stating she wanted her medication questio answered.  Attempted to transfer to call-a-nurse, but the line was not working.

## 2014-10-13 ENCOUNTER — Ambulatory Visit (INDEPENDENT_AMBULATORY_CARE_PROVIDER_SITE_OTHER): Payer: 59 | Admitting: Internal Medicine

## 2014-10-13 ENCOUNTER — Encounter: Payer: Self-pay | Admitting: Internal Medicine

## 2014-10-13 VITALS — BP 120/70 | HR 84 | Temp 98.6°F | Ht 65.5 in | Wt 140.5 lb

## 2014-10-13 DIAGNOSIS — R200 Anesthesia of skin: Secondary | ICD-10-CM | POA: Insufficient documentation

## 2014-10-13 DIAGNOSIS — R208 Other disturbances of skin sensation: Secondary | ICD-10-CM

## 2014-10-13 HISTORY — DX: Anesthesia of skin: R20.0

## 2014-10-13 LAB — COMPREHENSIVE METABOLIC PANEL
ALBUMIN: 3.8 g/dL (ref 3.5–5.2)
ALT: 18 U/L (ref 0–35)
AST: 21 U/L (ref 0–37)
Alkaline Phosphatase: 42 U/L (ref 39–117)
BUN: 10 mg/dL (ref 6–23)
CHLORIDE: 101 meq/L (ref 96–112)
CO2: 32 mEq/L (ref 19–32)
Calcium: 9.2 mg/dL (ref 8.4–10.5)
Creatinine, Ser: 0.7 mg/dL (ref 0.4–1.2)
GFR: 103.64 mL/min (ref >60.00)
GLUCOSE: 78 mg/dL (ref 70–99)
Potassium: 4 mEq/L (ref 3.5–5.1)
Sodium: 136 mEq/L (ref 135–145)
TOTAL PROTEIN: 7.5 g/dL (ref 6.0–8.3)
Total Bilirubin: 0.5 mg/dL (ref 0.2–1.2)

## 2014-10-13 LAB — TSH: TSH: 0.71 u[IU]/mL (ref 0.35–4.50)

## 2014-10-13 LAB — VITAMIN B12: Vitamin B-12: 272 pg/mL (ref 211–911)

## 2014-10-13 NOTE — Patient Instructions (Signed)
Labs today.  We will set up an evaluation with neurology.

## 2014-10-13 NOTE — Progress Notes (Signed)
Subjective:    Patient ID: Suzanne Evans, female    DOB: 09/23/1981, 33 y.o.   MRN: [id number]  HPI 33YO female presents for acute visit.  Tuesday night developed numbness and tingling on left scalp. This then spread to face. No facial droop. No dysphagia. Hearing and vision normal. No fever, neck pain. No other focal neurologic symptoms. Felt very tired on Tuesday, went to bed early, then felt normal on Wednesday. No previous episodes like this.No trauma to neck. No new medications or supplements.   Review of Systems  Constitutional: Negative for fever, chills, appetite change, fatigue and unexpected weight change.  Eyes: Negative for visual disturbance.  Respiratory: Negative for shortness of breath.   Cardiovascular: Negative for chest pain and leg swelling.  Gastrointestinal: Negative for abdominal pain.  Musculoskeletal: Negative for arthralgias, myalgias, neck pain and neck stiffness.  Skin: Negative for color change and rash.  Neurological: Positive for numbness. Negative for dizziness, tremors, seizures, syncope, facial asymmetry, speech difficulty, weakness, light-headedness and headaches.  Hematological: Negative for adenopathy. Does not bruise/bleed easily.  Psychiatric/Behavioral: Negative for sleep disturbance. The patient is not nervous/anxious.        Objective:    BP 120/70  Pulse 84  Temp(Src) 98.6 F (37 C) (Oral)  Ht 5' 5.5" (1.664 m)  Wt 140 lb 8 oz (63.73 kg)  BMI 23.02 kg/m2  SpO2 98% Physical Exam  Constitutional: She is oriented to person, place, and time. She appears well-developed and well-nourished. No distress.  HENT:  Head: Normocephalic and atraumatic.  Right Ear: External ear normal.  Left Ear: External ear normal.  Nose: Nose normal.  Mouth/Throat: Oropharynx is clear and moist. No oropharyngeal exudate.  Eyes: Conjunctivae are normal. Pupils are equal, round, and reactive to light. Right eye exhibits no discharge. Left eye exhibits no  discharge. No scleral icterus.  Neck: Normal range of motion. Neck supple. No tracheal deviation present. No thyromegaly present.  Cardiovascular: Normal rate, regular rhythm, normal heart sounds and intact distal pulses.  Exam reveals no gallop and no friction rub.   No murmur heard. Pulmonary/Chest: Effort normal and breath sounds normal. No accessory muscle usage. Not tachypneic. No respiratory distress. She has no decreased breath sounds. She has no wheezes. She has no rhonchi. She has no rales. She exhibits no tenderness.  Musculoskeletal: Normal range of motion. She exhibits no edema and no tenderness.  Lymphadenopathy:    She has no cervical adenopathy.  Neurological: She is alert and oriented to person, place, and time. She displays no atrophy and no tremor. No cranial nerve deficit or sensory deficit. She exhibits normal muscle tone. Coordination and gait normal.  Sensation intact over face.   Skin: Skin is warm and dry. No rash noted. She is not diaphoretic. No erythema. No pallor.  Psychiatric: She has a normal mood and affect. Her behavior is normal. Judgment and thought content normal.          Assessment & Plan:   Problem List Items Addressed This Visit     Unprioritized   Left facial numbness - Primary     Intermittent left facial numbness. Exam today is normal. Will check electrolytes and thyroid function. Symptoms are not in single nerve distribution. No findings to suggest Bell's palsy. Will set up neurology evaluation. Question if MRI brain and cervical spine might be helpful for further evaluation.    Relevant Orders      Comprehensive metabolic panel      V25  TSH      Ambulatory referral to Neurology       Return in about 4 weeks (around 11/10/2014) for Recheck.

## 2014-10-13 NOTE — Assessment & Plan Note (Signed)
Intermittent left facial numbness. Exam today is normal. Will check electrolytes and thyroid function. Symptoms are not in single nerve distribution. No findings to suggest Bell's palsy. Will set up neurology evaluation. Question if MRI brain and cervical spine might be helpful for further evaluation.

## 2014-10-13 NOTE — Progress Notes (Signed)
Pre visit review using our clinic review tool, if applicable. No additional management support is needed unless otherwise documented below in the visit note. 

## 2014-10-14 ENCOUNTER — Encounter: Payer: Self-pay | Admitting: *Deleted

## 2014-10-31 ENCOUNTER — Encounter: Payer: Self-pay | Admitting: Internal Medicine

## 2014-12-01 ENCOUNTER — Ambulatory Visit: Payer: 59 | Admitting: Neurology

## 2014-12-05 ENCOUNTER — Telehealth: Payer: Self-pay | Admitting: Neurology

## 2014-12-05 NOTE — Telephone Encounter (Signed)
Pt no showed 12/01/14 appt w/ Dr. Posey Pronto. This is a NP referral, referring provider's office is aware and have referred pt to another practice. A no show letter was not sent since this pt has been referred elsewhere / Sherri S.

## 2014-12-26 ENCOUNTER — Encounter: Payer: Self-pay | Admitting: *Deleted

## 2014-12-27 ENCOUNTER — Encounter: Payer: Self-pay | Admitting: Obstetrics & Gynecology

## 2015-04-17 ENCOUNTER — Ambulatory Visit: Payer: 59 | Admitting: Obstetrics & Gynecology

## 2015-10-10 ENCOUNTER — Other Ambulatory Visit: Payer: Self-pay

## 2015-11-10 LAB — OB RESULTS CONSOLE HEPATITIS B SURFACE ANTIGEN: Hepatitis B Surface Ag: NEGATIVE

## 2015-11-10 LAB — OB RESULTS CONSOLE HIV ANTIBODY (ROUTINE TESTING): HIV: NONREACTIVE

## 2015-11-10 LAB — OB RESULTS CONSOLE RUBELLA ANTIBODY, IGM: RUBELLA: IMMUNE

## 2015-11-10 LAB — OB RESULTS CONSOLE VARICELLA ZOSTER ANTIBODY, IGG: VARICELLA IGG: IMMUNE

## 2015-11-10 LAB — OB RESULTS CONSOLE RPR: RPR: NONREACTIVE

## 2015-12-05 LAB — OB RESULTS CONSOLE GC/CHLAMYDIA
Chlamydia: NEGATIVE
Gonorrhea: NEGATIVE

## 2015-12-31 NOTE — L&D Delivery Note (Signed)
Delivery Note  First Stage: Labor onset: 1205 Augmentation : AROM and Pitocin Analgesia /Anesthesia intrapartum: Epidural  AROM at 1205 - clear fluid   Second Stage: Complete dilation at 1640 Onset of pushing at 1645 FHR second stage FHR; 140 bpm/ Moderate variability/ +accels/ variable decels with ctx  Delivery of a viable female "Grayce Sessions"  at 1706 PM By Lars Pinks, CNM in ROA position with compound hand presentation  No nuchal cord Cord double clamped after cessation of pulsation, cut by FOB Cord blood sample N/A  Third Stage: Placenta delivered via Delena Bali intact with 3 VC @ 1712 Placenta disposition: hospital disposal  Uterine tone firm/ bleeding minimal   Small peri-clitoral laceration identified - oozing  Anesthesia for repair: 1% Lidocaine 30 mL and epidural  Repair: 2 interrupted stitches with 4.0 vicryl  Est. Blood Loss (mL): [id number]   Complications: none  Mom to postpartum.  Baby to Couplet care / Skin to Skin.  Newborn: Birth Weight: 3820 grams (8#6) Apgar Scores: 8, 9 Feeding planned: Breast  Lars Pinks, CNM

## 2016-02-15 ENCOUNTER — Encounter: Payer: Self-pay | Admitting: Family Medicine

## 2016-02-26 ENCOUNTER — Other Ambulatory Visit: Payer: Self-pay | Admitting: Obstetrics and Gynecology

## 2016-02-26 DIAGNOSIS — N6489 Other specified disorders of breast: Secondary | ICD-10-CM

## 2016-02-29 ENCOUNTER — Ambulatory Visit
Admission: RE | Admit: 2016-02-29 | Discharge: 2016-02-29 | Disposition: A | Discharge status: Home / Self Care | Payer: Managed Care, Other (non HMO) | Source: Ambulatory Visit | Attending: Obstetrics and Gynecology | Admitting: Obstetrics and Gynecology

## 2016-02-29 DIAGNOSIS — O26892 Other specified pregnancy related conditions, second trimester: Secondary | ICD-10-CM | POA: Diagnosis not present

## 2016-02-29 DIAGNOSIS — Z3A21 21 weeks gestation of pregnancy: Secondary | ICD-10-CM | POA: Diagnosis not present

## 2016-02-29 DIAGNOSIS — N6489 Other specified disorders of breast: Secondary | ICD-10-CM | POA: Insufficient documentation

## 2016-05-02 ENCOUNTER — Encounter: Payer: Self-pay | Admitting: *Deleted

## 2016-05-02 ENCOUNTER — Observation Stay
Admission: EM | Admit: 2016-05-02 | Discharge: 2016-05-02 | Disposition: A | Discharge status: Home / Self Care | Payer: Managed Care, Other (non HMO) | Attending: Obstetrics and Gynecology | Admitting: Obstetrics and Gynecology

## 2016-05-02 DIAGNOSIS — R102 Pelvic and perineal pain: Secondary | ICD-10-CM | POA: Insufficient documentation

## 2016-05-02 DIAGNOSIS — O9989 Other specified diseases and conditions complicating pregnancy, childbirth and the puerperium: Secondary | ICD-10-CM | POA: Diagnosis not present

## 2016-05-02 DIAGNOSIS — O26899 Other specified pregnancy related conditions, unspecified trimester: Secondary | ICD-10-CM

## 2016-05-02 DIAGNOSIS — Z3A31 31 weeks gestation of pregnancy: Secondary | ICD-10-CM | POA: Diagnosis not present

## 2016-05-02 HISTORY — DX: Other specified pregnancy related conditions, unspecified trimester: O26.899

## 2016-05-02 NOTE — Discharge Summary (Signed)
  Progress Notes by Boykin Nearing, MD at 05/02/2016 7:21 PM    Author: Boykin Nearing, MD Service: Obstetrics Author Type: Physician   Filed: 05/02/2016 7:26 PM Note Time: 05/02/2016 7:21 PM Status: Addendum   Editor: Boykin Nearing, MD (Physician)     Related Notes: Original Note by Boykin Nearing, MD (Physician) filed at 05/02/2016 7:25 PM   Expand All Collapse All   Patient ID: Suzanne Evans, female DOB: [birth year], 35 y.o. MRN: [id number] JELLISA MCTEE [birth year] G4 P2 [redacted]w[redacted]d presents for PELVIC PRESSURE  No LOF , no vaginal bleeding , O;BP 113/74 mmHg  Pulse 94  Temp(Src) 98.2 F (36.8 C) (Oral)  Resp 18 ABDsoft  CX internal os closed by nursing  NST130 + accels , no decels , good variability  Labs: n/a A: reassuring fetal monitoring , no evidence of PTL P:percautions given  Cont kick counts

## 2016-05-02 NOTE — OB Triage Note (Addendum)
Pt. Started contracting around 10:00 a.m. this morning; contractions more than the occasional contraction with vaginal pressure.

## 2016-05-02 NOTE — Progress Notes (Addendum)
Patient ID: Suzanne Evans, female   DOB: 07/27/1981, 35 y.o.   MRN: [id number] Suzanne Evans [birth year] G4 P2 [redacted]w[redacted]d presents for PELVIC PRESSURE  No LOF , no  vaginal bleeding , O;BP 113/74 mmHg  Pulse 94  Temp(Src) 98.2 F (36.8 C) (Oral)  Resp 18 ABDsoft  CX internal os closed by nursing  NST130  + accels  , no decels , good variability  Labs: n/a A: reassuring fetal monitoring , no evidence of PTL P:percautions given  Cont kick counts

## 2016-05-02 NOTE — Discharge Instructions (Signed)
Please get plenty of rest and fluids.  Discharge instructions given and education on kick counts and preterm labor given.

## 2016-06-05 LAB — OB RESULTS CONSOLE GBS: GBS: NEGATIVE

## 2016-06-27 ENCOUNTER — Inpatient Hospital Stay: Payer: Managed Care, Other (non HMO) | Admitting: Anesthesiology

## 2016-06-27 ENCOUNTER — Inpatient Hospital Stay
Admission: EM | Admit: 2016-06-27 | Discharge: 2016-06-28 | DRG: 767 | Disposition: A | Discharge status: Home / Self Care | Payer: Managed Care, Other (non HMO) | Attending: Obstetrics and Gynecology | Admitting: Obstetrics and Gynecology

## 2016-06-27 ENCOUNTER — Encounter: Payer: Self-pay | Admitting: *Deleted

## 2016-06-27 DIAGNOSIS — Z87891 Personal history of nicotine dependence: Secondary | ICD-10-CM

## 2016-06-27 DIAGNOSIS — B009 Herpesviral infection, unspecified: Secondary | ICD-10-CM | POA: Diagnosis present

## 2016-06-27 DIAGNOSIS — O9902 Anemia complicating childbirth: Secondary | ICD-10-CM | POA: Diagnosis present

## 2016-06-27 DIAGNOSIS — Z79899 Other long term (current) drug therapy: Secondary | ICD-10-CM

## 2016-06-27 DIAGNOSIS — Z302 Encounter for sterilization: Secondary | ICD-10-CM

## 2016-06-27 DIAGNOSIS — O326XX0 Maternal care for compound presentation, not applicable or unspecified: Secondary | ICD-10-CM | POA: Diagnosis not present

## 2016-06-27 DIAGNOSIS — O98513 Other viral diseases complicating pregnancy, third trimester: Secondary | ICD-10-CM | POA: Diagnosis present

## 2016-06-27 DIAGNOSIS — O9852 Other viral diseases complicating childbirth: Principal | ICD-10-CM | POA: Diagnosis present

## 2016-06-27 DIAGNOSIS — Z3A39 39 weeks gestation of pregnancy: Secondary | ICD-10-CM | POA: Diagnosis not present

## 2016-06-27 DIAGNOSIS — D509 Iron deficiency anemia, unspecified: Secondary | ICD-10-CM | POA: Diagnosis present

## 2016-06-27 LAB — TYPE AND SCREEN
ABO/RH(D): A POS
ANTIBODY SCREEN: NEGATIVE

## 2016-06-27 LAB — CBC
HCT: 31.7 % — ABNORMAL LOW (ref 35.0–47.0)
HEMOGLOBIN: 10.7 g/dL — AB (ref 12.0–16.0)
MCH: 29.1 pg (ref 26.0–34.0)
MCHC: 33.8 g/dL (ref 32.0–36.0)
MCV: 86.2 fL (ref 80.0–100.0)
Platelets: 320 10*3/uL (ref 150–440)
RBC: 3.68 MIL/uL — ABNORMAL LOW (ref 3.80–5.20)
RDW: 14 % (ref 11.5–14.5)
WBC: 12 10*3/uL — AB (ref 3.6–11.0)

## 2016-06-27 MED ORDER — ONDANSETRON HCL 4 MG/2ML IJ SOLN: 4.0000 mg | Freq: Four times a day (QID) | INTRAMUSCULAR | Status: DC | PRN

## 2016-06-27 MED ORDER — COCONUT OIL OIL
1.0000 "application " | TOPICAL_OIL | Status: DC | PRN
  Filled 2016-06-27: qty 120

## 2016-06-27 MED ORDER — ACETAMINOPHEN 325 MG PO TABS: 650.0000 mg | ORAL_TABLET | ORAL | Status: DC | PRN

## 2016-06-27 MED ORDER — IBUPROFEN 600 MG PO TABS
600.0000 mg | ORAL_TABLET | Freq: Four times a day (QID) | ORAL | Status: DC
  Administered 2016-06-27 – 2016-06-28 (×2): 600 mg via ORAL
  Filled 2016-06-27 (×2): qty 1

## 2016-06-27 MED ORDER — FENTANYL 2.5 MCG/ML W/ROPIVACAINE 0.2% IN NS 100 ML EPIDURAL INFUSION (ARMC-ANES)
EPIDURAL | Status: AC
  Administered 2016-06-27: 10 mL/h via EPIDURAL
  Filled 2016-06-27: qty 100

## 2016-06-27 MED ORDER — OXYTOCIN 40 UNITS IN LACTATED RINGERS INFUSION - SIMPLE MED
1.0000 m[IU]/min | INTRAVENOUS | Status: DC
  Administered 2016-06-27: 1 m[IU]/min via INTRAVENOUS
  Filled 2016-06-27: qty 1000

## 2016-06-27 MED ORDER — SIMETHICONE 80 MG PO CHEW: 80.0000 mg | CHEWABLE_TABLET | ORAL | Status: DC | PRN

## 2016-06-27 MED ORDER — BENZOCAINE-MENTHOL 20-0.5 % EX AERO
1.0000 "application " | INHALATION_SPRAY | CUTANEOUS | Status: DC | PRN
  Filled 2016-06-27: qty 56

## 2016-06-27 MED ORDER — OXYCODONE-ACETAMINOPHEN 5-325 MG PO TABS: 1.0000 | ORAL_TABLET | ORAL | Status: DC | PRN

## 2016-06-27 MED ORDER — LACTATED RINGERS IV SOLN
INTRAVENOUS | Status: DC
  Administered 2016-06-27 – 2016-06-28 (×3): via INTRAVENOUS

## 2016-06-27 MED ORDER — ONDANSETRON HCL 4 MG PO TABS: 4.0000 mg | ORAL_TABLET | ORAL | Status: DC | PRN

## 2016-06-27 MED ORDER — LIDOCAINE HCL (PF) 1 % IJ SOLN
30.0000 mL | INTRAMUSCULAR | Status: DC | PRN
  Administered 2016-06-27: 10 mL via SUBCUTANEOUS

## 2016-06-27 MED ORDER — DIPHENHYDRAMINE HCL 25 MG PO CAPS
25.0000 mg | ORAL_CAPSULE | Freq: Four times a day (QID) | ORAL | Status: DC | PRN
Start: 2016-06-27 — End: 2016-06-29

## 2016-06-27 MED ORDER — LIDOCAINE HCL (PF) 1 % IJ SOLN
INTRAMUSCULAR | Status: AC
  Administered 2016-06-27: 3 mL
  Filled 2016-06-27: qty 30

## 2016-06-27 MED ORDER — BUTORPHANOL TARTRATE 1 MG/ML IJ SOLN: 1.0000 mg | INTRAMUSCULAR | Status: DC | PRN

## 2016-06-27 MED ORDER — OXYTOCIN BOLUS FROM INFUSION
500.0000 mL | INTRAVENOUS | Status: DC
  Administered 2016-06-27: 500 mL via INTRAVENOUS

## 2016-06-27 MED ORDER — MISOPROSTOL 200 MCG PO TABS
ORAL_TABLET | ORAL | Status: AC
  Filled 2016-06-27: qty 4

## 2016-06-27 MED ORDER — OXYTOCIN 10 UNIT/ML IJ SOLN
INTRAMUSCULAR | Status: AC
  Filled 2016-06-27: qty 2

## 2016-06-27 MED ORDER — OXYCODONE-ACETAMINOPHEN 5-325 MG PO TABS: 2.0000 | ORAL_TABLET | ORAL | Status: DC | PRN

## 2016-06-27 MED ORDER — AMMONIA AROMATIC IN INHA
RESPIRATORY_TRACT | Status: AC
  Filled 2016-06-27: qty 10

## 2016-06-27 MED ORDER — LACTATED RINGERS IV SOLN
500.0000 mL | INTRAVENOUS | Status: DC | PRN
  Administered 2016-06-27: 1000 mL via INTRAVENOUS

## 2016-06-27 MED ORDER — DIBUCAINE 1 % RE OINT: 1.0000 "application " | TOPICAL_OINTMENT | RECTAL | Status: DC | PRN

## 2016-06-27 MED ORDER — BUPIVACAINE HCL (PF) 0.25 % IJ SOLN
INTRAMUSCULAR | Status: DC | PRN
  Administered 2016-06-27 (×2): 5 mL via EPIDURAL

## 2016-06-27 MED ORDER — ZOLPIDEM TARTRATE 5 MG PO TABS
5.0000 mg | ORAL_TABLET | Freq: Every evening | ORAL | Status: DC | PRN
Start: 2016-06-27 — End: 2016-06-29

## 2016-06-27 MED ORDER — SOD CITRATE-CITRIC ACID 500-334 MG/5ML PO SOLN: 30.0000 mL | ORAL | Status: DC | PRN

## 2016-06-27 MED ORDER — OXYTOCIN 40 UNITS IN LACTATED RINGERS INFUSION - SIMPLE MED: 2.5000 [IU]/h | INTRAVENOUS | Status: DC

## 2016-06-27 MED ORDER — WITCH HAZEL-GLYCERIN EX PADS: 1.0000 "application " | MEDICATED_PAD | CUTANEOUS | Status: DC | PRN

## 2016-06-27 MED ORDER — ONDANSETRON HCL 4 MG/2ML IJ SOLN: 4.0000 mg | INTRAMUSCULAR | Status: DC | PRN

## 2016-06-27 MED ORDER — SENNOSIDES-DOCUSATE SODIUM 8.6-50 MG PO TABS
2.0000 | ORAL_TABLET | ORAL | Status: DC
  Administered 2016-06-27 – 2016-06-28 (×2): 2 via ORAL
  Filled 2016-06-27 (×2): qty 2

## 2016-06-27 MED ORDER — TERBUTALINE SULFATE 1 MG/ML IJ SOLN: 0.2500 mg | Freq: Once | INTRAMUSCULAR | Status: DC | PRN

## 2016-06-27 NOTE — Anesthesia Postprocedure Evaluation (Signed)
Anesthesia Post Note  Patient: Suzanne Evans  Procedure(s) Performed: * No procedures listed *  Patient location during evaluation: PACU Anesthesia Type: General Level of consciousness: awake and alert Pain management: pain level controlled Vital Signs Assessment: post-procedure vital signs reviewed and stable Respiratory status: spontaneous breathing, nonlabored ventilation, respiratory function stable and patient connected to nasal cannula oxygen Cardiovascular status: blood pressure returned to baseline and stable Postop Assessment: no signs of nausea or vomiting Anesthetic complications: no    Last Vitals:  Filed Vitals:   06/27/16 1631 06/27/16 1638  BP: 112/73   Pulse: 90   Temp:  36.8 C  Resp:  16    Last Pain:  Filed Vitals:   06/27/16 1638  PainSc: T[place]

## 2016-06-27 NOTE — Progress Notes (Signed)
S:  Pt. Breathing well with contractions        On 6 milliunits of Pitocin - rating ctxs 7/10       Discussed AROM with pt. And she agrees  O:  VS: Blood pressure 125/77, pulse 89, temperature 99 F (37.2 C), temperature source Oral, resp. rate 16, height 5\' 6"  (1.676 m), weight 79.833 kg (176 lb).        FHR : baseline 140 bpm / variability Moderate / accelerations + / no decelerations        Toco: contractions every 3-4 minutes / Moderate         Cervix : 4cm/7/-1/vtx        Membranes: AROM - clear fluid  A: Latent labor     FHR category 1  P: May have epidural when ready      Continue Pitocin augmentation as needed      Reassess in 1-2 hours      Anticipate NSVD  Lars Pinks, CNM

## 2016-06-27 NOTE — H&P (Signed)
OB ADMISSION/ HISTORY & PHYSICAL:  Admission Date: 06/27/2016  8:04 AM  Admit Diagnosis: 39 weeks induction of labor   Suzanne Evans is a 35 y.o. female presenting for elective induction of labor for advanced cervical dilation.   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 Constipation   . Chicken pox   . Genital herpes      Past surgical history:  Past Surgical History  Procedure Laterality Date  . Appendectomy  2003    Family History:  Family History  Problem Relation Age of Onset  . Heart disease Maternal Grandmother   . Hyperlipidemia Maternal Grandmother   . Stroke Maternal Grandmother   . Hyperlipidemia Mother   . Obesity Mother   . Alcohol abuse Paternal Grandfather   . Obesity Father   . Obesity Sister   . Obesity Brother   . Lung cancer Maternal Grandfather     smoker  . Colon cancer Neg Hx   . Colitis Neg Hx   . Crohn's disease Neg Hx   . Diabetes Neg Hx   . Kidney disease Neg Hx   . Liver disease Neg Hx      Social History:  reports that she has quit smoking. She has never used smokeless tobacco. She reports that she drinks about 1.0 oz of alcohol per week. She reports that she does not use illicit drugs.   Allergies: Sulfa antibiotics    Current Medications at time of admission:  Prior to Admission medications   Medication Sig Start Date End Date Taking? Authorizing Provider  levonorgestrel (MIRENA) 20 MCG/24HR IUD 1 each  by Intrauterine route once.    Historical Provider, MD  valACYclovir (VALTREX) 500 MG tablet Take 1 tablet (500 mg total) by mouth daily. 03/01/14   Lahoma Crocker, MD     Review of Systems: Active FM Irregular ctxs  No LOF  / SROM  No bloody show   Physical Exam:  VS: Blood pressure 125/77, pulse 89, temperature 99 F (37.2 C), temperature source Oral, resp. rate 16, height 5\' 6"  (1.676 m), weight 79.833 kg (176 lb).  General: alert and oriented, appears calm Heart: RRR Lungs: Clear lung fields Abdomen: Gravid, soft and non-tender, non-distended / uterus: gravid, non-tender Extremities: no edema  Genitalia / VE: Dilation: 3.5 Effacement (%): 70 Station: -1 Exam by:: Tavin Vernet  Sterile Speculum exam for hx of HSV-2: no lesions present   FHR: baseline rate 145 bpm / variability Moderate / accelerations + / no decelerations TOCO: irregular  Assessment: [redacted] weeks gestation Induction of labor FHR category 1 Hx. Of HSV -2 - has been on suppression therapy  Plan:  1. Admit to [facility] for Induction of Labor  - Routine labor and delivery orders  - Begin Pitocin at 1 milliunit and increase by 2 milliunits   - Stadol 1mg  IVP every 1hour PRN for pain  - May have epidural  upon request  2. GBS Negative  - No Prophylaxis indicated 3. Contraception  - Plan for BTL tomorrow - Dr. Ouida Sills notified and okay to post surgery for tomorrow 4. Anticipate NSVD  - Proven Pelvis: 8#8oz  Dr. Leafy Ro notified of admission / plan of care  Lars Pinks, CNM

## 2016-06-27 NOTE — Anesthesia Procedure Notes (Signed)
Epidural Patient location during procedure: OB Start time: 06/27/2016 2:29 PM End time: 06/27/2016 2:50 PM  Staffing Resident/CRNA: Nikeshia Keetch Performed by: resident/CRNA   Preanesthetic Checklist Completed: patient identified, site marked, surgical consent, pre-op evaluation, timeout performed, IV checked, risks and benefits discussed and monitors and equipment checked  Epidural Patient position: sitting Prep: Betadine Patient monitoring: heart rate, continuous pulse ox and blood pressure Approach: midline Location: L3-L4 Injection technique: LOR air  Needle:  Needle type: Tuohy  Needle gauge: 17 G Needle length: 9 cm and 9 Catheter type: closed end flexible Catheter size: 20 Guage Test dose: negative and 1.5% lidocaine with Epi 1:200 K  Assessment Sensory level: T10 Events: blood not aspirated, injection not painful, no injection resistance, negative IV test and no paresthesia  Additional Notes   Patient tolerated the insertion well without complications.Reason for block:procedure for pain

## 2016-06-27 NOTE — Anesthesia Preprocedure Evaluation (Signed)
Anesthesia Evaluation   Patient awake    Reviewed: Allergy & Precautions, H&P , NPO status   History of Anesthesia Complications Negative for: history of anesthetic complications  Airway Mallampati: II  TM Distance: <3 FB Neck ROM: full    Dental no notable dental hx.    Pulmonary neg pulmonary ROS, former smoker,    Pulmonary exam normal        Cardiovascular negative cardio ROS Normal cardiovascular exam     Neuro/Psych negative neurological ROS  negative psych ROS   GI/Hepatic negative GI ROS, Neg liver ROS,   Endo/Other  negative endocrine ROS  Renal/GU negative Renal ROS  negative genitourinary   Musculoskeletal   Abdominal   Peds  Hematology negative hematology ROS (+)   Anesthesia Other Findings   Reproductive/Obstetrics (+) Pregnancy                             Anesthesia Physical Anesthesia Plan  ASA: II  Anesthesia Plan: Epidural   Post-op Pain Management:    Induction:   Airway Management Planned:   Additional Equipment:   Intra-op Plan:   Post-operative Plan:   Informed Consent: I have reviewed the patients History and Physical, chart, labs and discussed the procedure including the risks, benefits and alternatives for the proposed anesthesia with the patient or authorized representative who has indicated his/her understanding and acceptance.     Plan Discussed with: CRNA and Anesthesiologist  Anesthesia Plan Comments:         Anesthesia Quick Evaluation

## 2016-06-28 ENCOUNTER — Encounter: Payer: Self-pay | Admitting: *Deleted

## 2016-06-28 ENCOUNTER — Encounter
Admission: EM | Disposition: A | Discharge status: Home / Self Care | Payer: Self-pay | Source: Home / Self Care | Attending: Obstetrics and Gynecology

## 2016-06-28 ENCOUNTER — Inpatient Hospital Stay: Payer: Managed Care, Other (non HMO) | Admitting: Certified Registered Nurse Anesthetist

## 2016-06-28 HISTORY — PX: TUBAL LIGATION: SHX77

## 2016-06-28 LAB — CBC
HCT: 29.8 % — ABNORMAL LOW (ref 35.0–47.0)
HEMOGLOBIN: 10.4 g/dL — AB (ref 12.0–16.0)
MCH: 29.8 pg (ref 26.0–34.0)
MCHC: 34.9 g/dL (ref 32.0–36.0)
MCV: 85.4 fL (ref 80.0–100.0)
Platelets: 308 10*3/uL (ref 150–440)
RBC: 3.49 MIL/uL — AB (ref 3.80–5.20)
RDW: 13.5 % (ref 11.5–14.5)
WBC: 15.7 10*3/uL — AB (ref 3.6–11.0)

## 2016-06-28 LAB — RPR: RPR Ser Ql: NONREACTIVE

## 2016-06-28 SURGERY — LIGATION, FALLOPIAN TUBE, POSTPARTUM
Anesthesia: General

## 2016-06-28 MED ORDER — EPHEDRINE SULFATE 50 MG/ML IJ SOLN
INTRAMUSCULAR | Status: DC | PRN
  Administered 2016-06-28: 2.5 mg via INTRAVENOUS
  Administered 2016-06-28: 5 mg via INTRAVENOUS

## 2016-06-28 MED ORDER — DIPHENHYDRAMINE HCL 50 MG/ML IJ SOLN
INTRAMUSCULAR | Status: DC | PRN
  Administered 2016-06-28: 25 mg via INTRAVENOUS

## 2016-06-28 MED ORDER — ACETAMINOPHEN 10 MG/ML IV SOLN
INTRAVENOUS | Status: DC | PRN
  Administered 2016-06-28: 1000 mg via INTRAVENOUS

## 2016-06-28 MED ORDER — KETOROLAC TROMETHAMINE 30 MG/ML IJ SOLN
INTRAMUSCULAR | Status: DC | PRN
  Administered 2016-06-28: 30 mg via INTRAVENOUS

## 2016-06-28 MED ORDER — ONDANSETRON HCL 4 MG PO TABS: 4.0000 mg | ORAL_TABLET | ORAL | Status: DC | PRN

## 2016-06-28 MED ORDER — FENTANYL CITRATE (PF) 100 MCG/2ML IJ SOLN
INTRAMUSCULAR | Status: AC
  Administered 2016-06-28: 25 ug via INTRAVENOUS
  Filled 2016-06-28: qty 2

## 2016-06-28 MED ORDER — PROPOFOL 10 MG/ML IV BOLUS
INTRAVENOUS | Status: DC | PRN
  Administered 2016-06-28: 150 mg via INTRAVENOUS

## 2016-06-28 MED ORDER — LIDOCAINE HCL (CARDIAC) 20 MG/ML IV SOLN
INTRAVENOUS | Status: DC | PRN
  Administered 2016-06-28: 50 mg via INTRAVENOUS

## 2016-06-28 MED ORDER — OXYCODONE-ACETAMINOPHEN 5-325 MG PO TABS: 1.0000 | ORAL_TABLET | ORAL | Status: DC | PRN

## 2016-06-28 MED ORDER — ROCURONIUM BROMIDE 100 MG/10ML IV SOLN
INTRAVENOUS | Status: DC | PRN
  Administered 2016-06-28: 20 mg via INTRAVENOUS
  Administered 2016-06-28: 5 mg via INTRAVENOUS

## 2016-06-28 MED ORDER — ACETAMINOPHEN 10 MG/ML IV SOLN
INTRAVENOUS | Status: AC
  Filled 2016-06-28: qty 100

## 2016-06-28 MED ORDER — IBUPROFEN 600 MG PO TABS: 600.0000 mg | ORAL_TABLET | Freq: Four times a day (QID) | ORAL | Status: DC

## 2016-06-28 MED ORDER — ONDANSETRON HCL 4 MG/2ML IJ SOLN
INTRAMUSCULAR | Status: DC | PRN
  Administered 2016-06-28: 4 mg via INTRAVENOUS

## 2016-06-28 MED ORDER — SUGAMMADEX SODIUM 200 MG/2ML IV SOLN
INTRAVENOUS | Status: DC | PRN
  Administered 2016-06-28: 175 mg via INTRAVENOUS

## 2016-06-28 MED ORDER — PHENYLEPHRINE HCL 10 MG/ML IJ SOLN
INTRAMUSCULAR | Status: DC | PRN
  Administered 2016-06-28: 200 ug via INTRAVENOUS

## 2016-06-28 MED ORDER — BUPIVACAINE HCL (PF) 0.5 % IJ SOLN
INTRAMUSCULAR | Status: AC
  Filled 2016-06-28: qty 30

## 2016-06-28 MED ORDER — ONDANSETRON HCL 4 MG/2ML IJ SOLN: 4.0000 mg | Freq: Once | INTRAMUSCULAR | Status: DC | PRN

## 2016-06-28 MED ORDER — DEXAMETHASONE SODIUM PHOSPHATE 10 MG/ML IJ SOLN
INTRAMUSCULAR | Status: DC | PRN
  Administered 2016-06-28: 10 mg via INTRAVENOUS

## 2016-06-28 MED ORDER — FENTANYL CITRATE (PF) 100 MCG/2ML IJ SOLN
INTRAMUSCULAR | Status: DC | PRN
  Administered 2016-06-28 (×2): 50 ug via INTRAVENOUS

## 2016-06-28 MED ORDER — BUPIVACAINE HCL 0.5 % IJ SOLN
INTRAMUSCULAR | Status: DC | PRN
  Administered 2016-06-28: 2 mL

## 2016-06-28 MED ORDER — FENTANYL CITRATE (PF) 100 MCG/2ML IJ SOLN
25.0000 ug | INTRAMUSCULAR | Status: DC | PRN
  Administered 2016-06-28 (×4): 25 ug via INTRAVENOUS

## 2016-06-28 MED ORDER — SUCCINYLCHOLINE CHLORIDE 20 MG/ML IJ SOLN
INTRAMUSCULAR | Status: DC | PRN
  Administered 2016-06-28: 100 mg via INTRAVENOUS

## 2016-06-28 SURGICAL SUPPLY — 31 items
APPLICATOR COTTON TIP 6IN STRL (MISCELLANEOUS) ×2 IMPLANT
BLADE SURG SZ11 CARB STEEL (BLADE) ×2 IMPLANT
CANISTER SUCT 1200ML W/VALVE (MISCELLANEOUS) ×2 IMPLANT
CHLORAPREP W/TINT 26ML (MISCELLANEOUS) ×2 IMPLANT
DRAPE LAPAROTOMY 100X77 ABD (DRAPES) ×2 IMPLANT
DRSG TEGADERM 2-3/8X2-3/4 SM (GAUZE/BANDAGES/DRESSINGS) ×2 IMPLANT
DRSG TEGADERM 4X4.75 (GAUZE/BANDAGES/DRESSINGS) ×2 IMPLANT
ELECT CAUTERY BLADE 6.4 (BLADE) ×2 IMPLANT
ELECT REM PT RETURN 9FT ADLT (ELECTROSURGICAL) ×2
ELECTRODE REM PT RTRN 9FT ADLT (ELECTROSURGICAL) ×1 IMPLANT
GAUZE SPONGE NON-WVN 2X2 STRL (MISCELLANEOUS) ×1 IMPLANT
GLOVE BIO SURGEON STRL SZ8 (GLOVE) ×2 IMPLANT
GOWN STRL REUS W/ TWL LRG LVL3 (GOWN DISPOSABLE) ×1 IMPLANT
GOWN STRL REUS W/ TWL XL LVL3 (GOWN DISPOSABLE) ×1 IMPLANT
GOWN STRL REUS W/TWL LRG LVL3 (GOWN DISPOSABLE) ×2
GOWN STRL REUS W/TWL XL LVL3 (GOWN DISPOSABLE) ×2
KIT RM TURNOVER STRD PROC AR (KITS) ×2 IMPLANT
LABEL OR SOLS (LABEL) ×2 IMPLANT
LIQUID BAND (GAUZE/BANDAGES/DRESSINGS) ×2 IMPLANT
NDL HYPO 25X1 1.5 SAFETY (NEEDLE) ×1 IMPLANT
NEEDLE HYPO 25X1 1.5 SAFETY (NEEDLE) ×2 IMPLANT
NS IRRIG 500ML POUR BTL (IV SOLUTION) ×2 IMPLANT
PACK BASIN MINOR ARMC (MISCELLANEOUS) ×2 IMPLANT
SPONGE VERSALON 2X2 STRL (MISCELLANEOUS) ×2
STRIP CLOSURE SKIN 1/4X4 (GAUZE/BANDAGES/DRESSINGS) ×2 IMPLANT
SUT PLAIN GUT 0 (SUTURE) ×4 IMPLANT
SUT VIC AB 2-0 UR6 27 (SUTURE) ×2 IMPLANT
SUT VIC AB 4-0 SH 27 (SUTURE) ×2
SUT VIC AB 4-0 SH 27XANBCTRL (SUTURE) ×1 IMPLANT
SWABSTK COMLB BENZOIN TINCTURE (MISCELLANEOUS) ×2 IMPLANT
SYRINGE 10CC LL (SYRINGE) ×2 IMPLANT

## 2016-06-28 NOTE — Anesthesia Preprocedure Evaluation (Signed)
Anesthesia Evaluation  Patient identified by MRN, date of birth, ID band Patient awake    Reviewed: Allergy & Precautions, NPO status , Patient's Chart, lab work & pertinent test results  History of Anesthesia Complications Negative for: history of anesthetic complications  Airway Mallampati: I       Dental  (+) Teeth Intact   Pulmonary neg pulmonary ROS, former smoker,    breath sounds clear to auscultation       Cardiovascular negative cardio ROS   Rhythm:Regular     Neuro/Psych    GI/Hepatic negative GI ROS, Neg liver ROS,   Endo/Other  negative endocrine ROS  Renal/GU negative Renal ROS  negative genitourinary   Musculoskeletal negative musculoskeletal ROS (+)   Abdominal Normal abdominal exam  (+)   Peds negative pediatric ROS (+)  Hematology negative hematology ROS (+)   Anesthesia Other Findings   Reproductive/Obstetrics                             Anesthesia Physical Anesthesia Plan  ASA: I  Anesthesia Plan: General   Post-op Pain Management:    Induction: Intravenous  Airway Management Planned: Oral ETT  Additional Equipment:   Intra-op Plan:   Post-operative Plan: Extubation in OR  Informed Consent: I have reviewed the patients History and Physical, chart, labs and discussed the procedure including the risks, benefits and alternatives for the proposed anesthesia with the patient or authorized representative who has indicated his/her understanding and acceptance.     Plan Discussed with: CRNA  Anesthesia Plan Comments:         Anesthesia Quick Evaluation

## 2016-06-28 NOTE — Addendum Note (Signed)
Addendum  created 06/28/16 1459 by Johnna Acosta, CRNA   Modules edited: Anesthesia Medication Administration

## 2016-06-28 NOTE — Transfer of Care (Signed)
Immediate Anesthesia Transfer of Care Note  Patient: Suzanne Evans  Procedure(s) Performed: Procedure(s): POST PARTUM TUBAL LIGATION (N/A)  Patient Location: PACU  Anesthesia Type:General  Level of Consciousness: awake, alert  and oriented  Airway & Oxygen Therapy: Patient Spontanous Breathing  Post-op Assessment: Report given to RN and Post -op Vital signs reviewed and stable  Post vital signs: Reviewed and stable  Last Vitals:  Filed Vitals:   06/28/16 0733 06/28/16 0949  BP: 112/69 112/79  Pulse: 86 83  Temp: 37 C 36.8 C  Resp: 18 16    Last Pain:  Filed Vitals:   06/28/16 0951  PainSc: 0-No pain      Patients Stated Pain Goal: 0 ([phone])  Complications: No apparent anesthesia complications

## 2016-06-28 NOTE — Anesthesia Procedure Notes (Signed)
Procedure Name: Intubation Date/Time: 06/28/2016 10:21 AM Performed by: Johnna Acosta Pre-anesthesia Checklist: Patient identified, Emergency Drugs available, Suction available, Patient being monitored and Timeout performed Patient Re-evaluated:Patient Re-evaluated prior to inductionOxygen Delivery Method: Circle system utilized Preoxygenation: Pre-oxygenation with 100% oxygen Intubation Type: IV induction Ventilation: Mask ventilation without difficulty Laryngoscope Size: Miller and 2 Grade View: Grade I Tube type: Oral Tube size: 7.5 mm Number of attempts: 1 Airway Equipment and Method: Stylet Placement Confirmation: ETT inserted through vocal cords under direct vision,  positive ETCO2 and breath sounds checked- equal and bilateral Secured at: 20 cm Tube secured with: Tape

## 2016-06-28 NOTE — Discharge Summary (Signed)
Physician Obstetric Discharge Summary  Patient ID: [name] MRN: [id number] DOB/AGE: [birth year] 35 y.o.   Date of Admission: 06/27/2016  Date of Discharge: 06/28/16  Admitting Diagnosis: induction for advanced cervical dilation  Secondary Diagnosis: elective sterilization   Mode of Delivery: normal spontaneous vaginal delivery     Discharge Diagnosis: uncomplicated SVD + PP BTL    Intrapartum Procedures:   Post partum procedures: postpartum tubal ligation  Complications: none   Brief Hospital Course  Suzanne Evans is a [id number] who had a SVD on 06/27/16;  for further details of this delivery, please refer to the delivery note.  Patient had an uncomplicated postpartum course. Underwent a PP BTL on 06/28/16 which was uncomplicated   By time of discharge on PPD#1, her pain was controlled on oral pain medications; she had appropriate lochia and was ambulating, voiding without difficulty and tolerating regular diet.  She was deemed stable for discharge to home.    : Suzanne Evans is a [id number] who underwent cesarean section on 06/27/2016 - 06/28/2016.  Patient had an uncomplicated surgery; for further details of this surgery, please refer to the operative note.  Patient had an uncomplicated postpartum course.  By time of discharge on POD#1, her pain was controlled on oral pain medications; she had appropriate lochia and was ambulating, voiding without difficulty, tolerating regular diet and passing flatus.   She was deemed stable for discharge to home.    Labs: CBC Latest Ref Rng 06/28/2016 06/27/2016 03/03/2014  WBC 3.6 - 11.0 K/uL 15.7(H) 12.0(H) 6.2  Hemoglobin 12.0 - 16.0 g/dL 10.4(L) 10.7(L) 13.2  Hematocrit 35.0 - 47.0 % 29.8(L) 31.7(L) 39.5  Platelets 150 - 440 K/uL 308 320 291.0   A POS  Physical exam:  Blood pressure 107/67, pulse 76, temperature 97.1 F (36.2 C), temperature source Tympanic, resp. rate 15, height 5\' 6"  (1.676 m), weight 176 lb (79.833 kg), SpO2 100  %, currently breastfeeding. General: alert and no distress Lochia: appropriate Abdomen: soft, NT Uterine Fundus: firm  Extremities: No evidence of DVT seen on physical exam. No lower extremity edema.  Discharge Instructions: Per After Visit Summary. Activity: Advance as tolerated. Pelvic rest for 6 weeks.  Also refer to Discharge Instructions Diet: Regular Medications:   Medication List    STOP taking these medications        levonorgestrel 20 MCG/24HR IUD  Commonly known as:  MIRENA      TAKE these medications        ibuprofen 600 MG tablet  Commonly known as:  ADVIL,MOTRIN  Take 1 tablet (600 mg total) by mouth every 6 (six) hours.     lansoprazole 15 MG capsule  Commonly known as:  PREVACID  Take 15 mg by mouth daily at 12 noon.     multivitamin capsule  Take 1 capsule by mouth daily.     ondansetron 4 MG tablet  Commonly known as:  ZOFRAN  Take 1 tablet (4 mg total) by mouth every 4 (four) hours as needed for nausea.     oxyCODONE-acetaminophen 5-325 MG tablet  Commonly known as:  PERCOCET/ROXICET  Take 1 tablet by mouth every 4 (four) hours as needed (pain scale 4-7).     valACYclovir 500 MG tablet  Commonly known as:  VALTREX  Take 1 tablet (500 mg total) by mouth daily.       Outpatient follow up:      Follow-up Information    Follow up with Corinthia Helmers, MD In 2 weeks.  Specialty:  Obstetrics and Gynecology   Why:  For wound re-check   Contact information:   [address] [place] [address] [phone]      Postpartum contraception: bilateral tubal ligation, complete   Discharged Condition: good  Discharged to: home   Newborn Data: Disposition:home with mother  Apgars: APGAR (1 MIN): 8   APGAR (5 MINS): 9   APGAR (10 MINS):    Baby Feeding: Breast  Suzanne Takeshita, MD 06/28/2016 11:44 AM

## 2016-06-28 NOTE — Anesthesia Postprocedure Evaluation (Signed)
Anesthesia Post Note  Patient: Suzanne Evans  Procedure(s) Performed: Procedure(s) (LRB): POST PARTUM TUBAL LIGATION (N/A)  Patient location during evaluation: PACU Anesthesia Type: General Level of consciousness: awake Pain management: satisfactory to patient Vital Signs Assessment: post-procedure vital signs reviewed and stable Respiratory status: spontaneous breathing Cardiovascular status: blood pressure returned to baseline Anesthetic complications: no    Last Vitals:  Filed Vitals:   06/28/16 1209 06/28/16 1223  BP: 106/68 108/67  Pulse: 69 65  Temp: 36.8 C   Resp: 14 13    Last Pain:  Filed Vitals:   06/28/16 1225  PainSc: 2                  VAN STAVEREN,Orli Degrave

## 2016-06-28 NOTE — Progress Notes (Signed)
Post Partum Day 1 Subjective: no complaints  Pt desire PP BTL  Today . NPO  Objective: Blood pressure 112/69, pulse 86, temperature 98.6 F (37 C), temperature source Oral, resp. rate 18, height 5\' 6"  (1.676 m), weight 176 lb (79.833 kg), SpO2 100 %, unknown if currently breastfeeding.  Physical Exam:  General: alert and cooperative Lochia: appropriate Uterine Fundus: firm Incision:  DVT Evaluation: No evidence of DVT seen on physical exam.   Recent Labs  06/27/16 0907 06/28/16 0518  HGB 10.7* 10.4*  HCT 31.7* 29.8*    Assessment/Plan:  Ready for PP BTL  Pt has been counseled    LOS: 1 day   Suzanne Evans 06/28/2016, 7:37 AM

## 2016-06-28 NOTE — Brief Op Note (Signed)
06/27/2016 - 06/28/2016  10:54 AM  PATIENT:  Suzanne Evans  35 y.o. female  PRE-OPERATIVE DIAGNOSIS:  PMB  Polyp  POST-OPERATIVE DIAGNOSIS:  PMB  Polyp  PROCEDURE:  Procedure(s): POST PARTUM TUBAL LIGATION (N/A)  SURGEON:  Surgeon(s) and Role:    Boykin Nearing, MD - Primary  PHYSICIAN ASSISTANT:   ASSISTANTS: none   ANESTHESIA:   general  EBL:  Total I/O In: 600 [I.V.:600] Out: 0   ebl minimal   BLOOD ADMINISTERED:none  DRAINS: none   LOCAL MEDICATIONS USED:  MARCAINE     SPECIMEN:  Source of Specimen:  portion of right and left tube   DISPOSITION OF SPECIMEN:  PATHOLOGY  COUNTS:  YES  TOURNIQUET:  * No tourniquets in log *  DICTATION: .Other Dictation: Dictation Number verbal  PLAN OF CARE: back to postpartum  PATIENT DISPOSITION:  PACU - hemodynamically stable.   Delay start of Pharmacological VTE agent (>24hrs) due to surgical blood loss or risk of bleeding: not applicable

## 2016-06-28 NOTE — Discharge Summary (Signed)
Reviewed D/C instructions with patient and significant other including when to call the MD, f/u appointment instructions, prescriptions, and postpartum care at home.  Addressed patient questions as needed.  Obtained a signed copy of the D/C instructions for the patient file and provided a signed copy to the patient.  Discharged patient home via wheelchair escorted by nursing staff.

## 2016-06-28 NOTE — Op Note (Signed)
NAMEROMUNDA, SCHREIB NO.:  [id number]  MEDICAL RECORD NO.:  [id number]  LOCATION:  [facility]                         FACILITY:  [facility]  PHYSICIAN:  Laverta Baltimore, MDDATE OF BIRTH:  [birth year]  DATE OF PROCEDURE:  06/28/2016 DATE OF DISCHARGE:                              OPERATIVE REPORT   PREOPERATIVE DIAGNOSIS:  Elective sterilization.  POSTOPERATIVE DIAGNOSIS:  Elective sterilization.  PROCEDURE PERFORMED:  Postpartum bilateral tubal ligation, [place].  SURGEON:  Laverta Baltimore, MD  ANESTHESIA:  General endotracheal anesthesia.  SURGEON:  Laverta Baltimore, MD  INDICATION:  A 35 year old, gravida 4, now para 3, status post uncomplicated spontaneous vaginal delivery, the day before the procedure, patient has been committed to elective sterilization and reconfirms that desire on the day of the procedure.  DESCRIPTION OF PROCEDURE:  After adequate general endotracheal anesthesia, the patient's abdomen was prepped and draped in normal sterile fashion.  Time-out was performed.  A 15 mm infraumbilical incision was made.  Fascia was identified and opened with Mayo scissors and the peritoneum was opened sharply without difficulty.  Attention was directed to the patient's right fallopian tube which was grasped with Babcock clamps and after visualizing the fimbriated end of the fallopian tube, 2 separate 0 plain gut sutures were placed at the midportion of the fallopian tube and a 1.5 cm portion of fallopian tube was removed. Similar procedure was repeated on the left fallopian tube after visualizing the fimbriated end.  Two separate 0 plain gut sutures were placed and a 1.5 cm portion of fallopian tube was removed.  Good hemostasis noted.  Fascia was closed with a running 2-0 Vicryl suture and the skin was reapproximated with interrupted 4-0 Vicryl suture. Tegaderm applied to the incision.  There were no complications.  ESTIMATED BLOOD LOSS:   Minimal.  The patient tolerated procedure well, and was taken to recovery room in good condition.          ______________________________ Laverta Baltimore, MD     TS/MEDQ  D:  06/28/2016  T:  06/28/2016  Job:  [id number]

## 2016-06-28 NOTE — Progress Notes (Signed)
BTL consents signed. Pre-procedure checklist completed. Pre/post procedure education done, patient verbalizes understanding. VSS and patient has voided. Report given to OR. Patient awaiting transport to OR.   Jonne Ply, RN

## 2016-07-01 LAB — SURGICAL PATHOLOGY

## 2017-07-10 DIAGNOSIS — D225 Melanocytic nevi of trunk: Secondary | ICD-10-CM | POA: Diagnosis not present

## 2017-07-10 DIAGNOSIS — L218 Other seborrheic dermatitis: Secondary | ICD-10-CM | POA: Diagnosis not present

## 2017-07-10 DIAGNOSIS — D2272 Melanocytic nevi of left lower limb, including hip: Secondary | ICD-10-CM | POA: Diagnosis not present

## 2017-07-10 DIAGNOSIS — D2261 Melanocytic nevi of right upper limb, including shoulder: Secondary | ICD-10-CM | POA: Diagnosis not present

## 2017-07-18 DIAGNOSIS — J358 Other chronic diseases of tonsils and adenoids: Secondary | ICD-10-CM | POA: Diagnosis not present

## 2017-07-23 DIAGNOSIS — H5203 Hypermetropia, bilateral: Secondary | ICD-10-CM | POA: Diagnosis not present

## 2017-08-20 DIAGNOSIS — F411 Generalized anxiety disorder: Secondary | ICD-10-CM | POA: Diagnosis not present

## 2017-08-20 DIAGNOSIS — Z01419 Encounter for gynecological examination (general) (routine) without abnormal findings: Secondary | ICD-10-CM | POA: Diagnosis not present

## 2017-08-20 DIAGNOSIS — R87610 Atypical squamous cells of undetermined significance on cytologic smear of cervix (ASC-US): Secondary | ICD-10-CM | POA: Diagnosis not present

## 2017-08-20 DIAGNOSIS — Z1151 Encounter for screening for human papillomavirus (HPV): Secondary | ICD-10-CM | POA: Diagnosis not present

## 2017-08-20 DIAGNOSIS — Z124 Encounter for screening for malignant neoplasm of cervix: Secondary | ICD-10-CM | POA: Diagnosis not present

## 2017-12-05 ENCOUNTER — Encounter: Payer: Self-pay | Admitting: Internal Medicine

## 2017-12-05 ENCOUNTER — Encounter (INDEPENDENT_AMBULATORY_CARE_PROVIDER_SITE_OTHER): Payer: Self-pay

## 2017-12-05 ENCOUNTER — Ambulatory Visit (INDEPENDENT_AMBULATORY_CARE_PROVIDER_SITE_OTHER): Payer: 59 | Admitting: Internal Medicine

## 2017-12-05 VITALS — BP 122/84 | HR 72 | Temp 98.9°F | Ht 66.0 in | Wt 151.5 lb

## 2017-12-05 DIAGNOSIS — R87610 Atypical squamous cells of undetermined significance on cytologic smear of cervix (ASC-US): Secondary | ICD-10-CM | POA: Insufficient documentation

## 2017-12-05 DIAGNOSIS — M21612 Bunion of left foot: Secondary | ICD-10-CM | POA: Diagnosis not present

## 2017-12-05 DIAGNOSIS — K5900 Constipation, unspecified: Secondary | ICD-10-CM

## 2017-12-05 DIAGNOSIS — R109 Unspecified abdominal pain: Secondary | ICD-10-CM | POA: Diagnosis not present

## 2017-12-05 DIAGNOSIS — Z0000 Encounter for general adult medical examination without abnormal findings: Secondary | ICD-10-CM | POA: Diagnosis not present

## 2017-12-05 HISTORY — DX: Unspecified abdominal pain: R10.9

## 2017-12-05 HISTORY — DX: Constipation, unspecified: K59.00

## 2017-12-05 HISTORY — DX: Bunion of left foot: M21.612

## 2017-12-05 LAB — URINALYSIS, ROUTINE W REFLEX MICROSCOPIC
Bilirubin Urine: NEGATIVE
HGB URINE DIPSTICK: NEGATIVE
Ketones, ur: NEGATIVE
Nitrite: NEGATIVE
Specific Gravity, Urine: 1.015 (ref 1.000–1.030)
Total Protein, Urine: NEGATIVE
URINE GLUCOSE: NEGATIVE
Urobilinogen, UA: 0.2 (ref 0.0–1.0)
pH: 7 (ref 5.0–8.0)

## 2017-12-05 NOTE — Patient Instructions (Signed)
Please get back to [place] on hepatitis B titer information needs to be > or =10  Follow up in 6-12 months sooner if needed    Constipation, Adult Constipation is when a person has fewer bowel movements in a week than normal, has difficulty having a bowel movement, or has stools that are dry, hard, or larger than normal. Constipation may be caused by an underlying condition. It may become worse with age if a person takes certain medicines and does not take in enough fluids. Follow these instructions at home: Eating and drinking   Eat foods that have a lot of fiber, such as fresh fruits and vegetables, whole grains, and beans.  Limit foods that are high in fat, low in fiber, or overly processed, such as french fries, hamburgers, cookies, candies, and soda.  Drink enough fluid to keep your urine clear or pale yellow. General instructions  Exercise regularly or as told by your health care provider.  Go to the restroom when you have the urge to go. Do not hold it in.  Take over-the-counter and prescription medicines only as told by your health care provider. These include any fiber supplements.  Practice pelvic floor retraining exercises, such as deep breathing while relaxing the lower abdomen and pelvic floor relaxation during bowel movements.  Watch your condition for any changes.  Keep all follow-up visits as told by your health care provider. This is important. Contact a health care provider if:  You have pain that gets worse.  You have a fever.  You do not have a bowel movement after 4 days.  You vomit.  You are not hungry.  You lose weight.  You are bleeding from the anus.  You have thin, pencil-like stools. Get help right away if:  You have a fever and your symptoms suddenly get worse.  You leak stool or have blood in your stool.  Your abdomen is bloated.  You have severe pain in your abdomen.  You feel dizzy or you faint. This information is not intended to  replace advice given to you by your health care provider. Make sure you discuss any questions you have with your health care provider. Document Released: 09/13/2004 Document Revised: 07/05/2016 Document Reviewed: 06/05/2016 Elsevier Interactive Patient Education  2017 [place] A bunion is a bump on the base of the big toe that forms when the bones of the big toe joint move out of position. Bunions may be small at first, but they often get larger over time. The can make walking painful. What are the causes? A bunion may be caused by:  Wearing narrow or pointed shoes that force the big toe to press against the other toes.  Abnormal foot development that causes the foot to roll inward (pronate).  Changes in the foot that are caused by certain diseases, such as rheumatoid arthritis and polio.  A foot injury.  What increases the risk? The following factors may make you more likely to develop this condition:  Wearing shoes that squeeze the toes together.  Having certain diseases, such as: ? Rheumatoid arthritis. ? Polio. ? Cerebral palsy.  Having family members who have bunions.  Being born with a foot deformity, such as flat feet or low arches.  Doing activities that put a lot of pressure on the feet, such as ballet dancing.  What are the signs or symptoms? The main symptom of a bunion is a noticeable bump on the big toe. Other symptoms may include:  Pain.  Swelling around the big toe.  Redness and inflammation.  Thick or hardened skin on the big toe or between the toes.  Stiffness or loss of motion in the big toe.  Trouble with walking.  How is this diagnosed? A bunion may be diagnosed based on your symptoms, medical history, and activities. You may have tests, such as:  X-rays. These allow your health care provider to check the position of the bones in your foot and look for damage to your joint. They also help your health care provider to determine the  severity of your bunion and the best way to treat it.  Joint aspiration. In this test, a sample of fluid is removed from the toe joint. This test, which may be done if you are in a lot of pain, helps to rule out diseases that cause painful swelling of the joints, such as arthritis.  How is this treated? There is no cure for a bunion, but treatment can help to prevent a bunion from getting worse. Treatment depends on the severity of your symptoms. Your health care provider may recommend:  Wearing shoes that have a wide toe box.  Using bunion pads to cushion the affected area.  Taping your toes together to keep them in a normal position.  Placing a device inside your shoe (orthotics) to help reduce pressure on your toe joint.  Taking medicine to ease pain, inflammation, and swelling.  Applying heat or ice to the affected area.  Doing stretching exercises.  Surgery to remove scar tissue and move the toes back into their normal position. This treatment is rare.  Follow these instructions at home:  Support your toe joint with proper footwear, shoe padding, or taping as told by your health care provider.  Take over-the-counter and prescription medicines only as told by your health care provider.  If directed, apply ice to the injured area: ? Put ice in a plastic bag. ? Place a towel between your skin and the bag. ? Leave the ice on for 20 minutes, 2-3 times per day.  If directed, apply heat to the affected area before you exercise. Use the heat source that your health care provider recommends, such as a moist heat pack or a heating pad. ? Place a towel between your skin and the heat source. ? Leave the heat on for 20-30 minutes. ? Remove the heat if your skin turns bright red. This is especially important if you are unable to feel pain, heat, or cold. You may have a greater risk of getting burned.  Do exercises as told by your health care provider.  Keep all follow-up visits as  told by your health care provider. Contact a health care provider if:  Your symptoms get worse.  Your symptoms do not improve in 2 weeks. Get help right away if:  You have severe pain and trouble with walking. This information is not intended to replace advice given to you by your health care provider. Make sure you discuss any questions you have with your health care provider. Document Released: 12/16/2005 Document Revised: 05/23/2016 Document Reviewed: 07/16/2015 Elsevier Interactive Patient Education  [employer].

## 2017-12-05 NOTE — Progress Notes (Signed)
Chief Complaint  Patient presents with  . Establish Care   Establish 1. She reports left lateral foot bunion x years and wants to see podiatry Dr. Amalia Hailey in [place]. She uses pads to help  2. H/o ASCUS +HPV pap sees [facility] and due to f/u 2019  3. Constipation x 1.5 years she reports she knows she needs to drink more water and she will try stool softnercolace bid. C/o LUQ fullness s/p meals and gas related She has been straining recently and seen blood in stool recently advised if continues let us know  4. Occas. B/l lower back MSK pain achy with exercise or lifting 30 # kid and improves with Advil. Mild    Review of Systems  Constitutional: Negative for weight loss.  HENT: Positive for tinnitus.        Zyrtec helps ear ringing  Eyes:       No vision problems   Respiratory: Negative for shortness of breath.   Cardiovascular: Negative for chest pain.  Gastrointestinal: Positive for abdominal pain, blood in stool and constipation.  Musculoskeletal: Positive for back pain.  Skin: Negative for rash.  Neurological: Negative for headaches.  Psychiatric/Behavioral: Negative for memory loss.   Past Medical History:  Diagnosis Date  . Abnormal Pap smear of cervix    ASCUS, +HPV  . Bunion, left foot   . Chicken pox   . Constipation   . Genital herpes    Past Surgical History:  Procedure Laterality Date  . APPENDECTOMY  2003  . TUBAL LIGATION N/A 06/28/2016   Procedure: POST PARTUM TUBAL LIGATION;  Surgeon: Boykin Nearing, MD;  Location: [facility] ORS;  Service: Gynecology;  Laterality: N/A;   Family History  Problem Relation Age of Onset  . Heart disease Maternal Grandmother   . Hyperlipidemia Maternal Grandmother   . Stroke Maternal Grandmother   . Hyperlipidemia Mother   . Obesity Mother   . Alcohol abuse Paternal Grandfather   . Obesity Father   . Obesity Sister   . Obesity Brother   . Lung cancer Maternal Grandfather        smoker  . Colon cancer Neg Hx   .  Colitis Neg Hx   . Crohn's disease Neg Hx   . Diabetes Neg Hx   . Kidney disease Neg Hx   . Liver disease Neg Hx    Social History   Socioeconomic History  . Marital status: Divorced    Spouse name: Not on file  . Number of children: 2  . Years of education: 78  . Highest education level: Not on file  Social Needs  . Financial resource strain: Not on file  . Food insecurity - worry: Not on file  . Food insecurity - inability: Not on file  . Transportation needs - medical: Not on file  . Transportation needs - non-medical: Not on file  Occupational History  . Occupation: Designer, jewellery    Comment: [place] at [employer]  . Smoking status: Former Smoker    Packs/day: 1.00    Years: 8.00    Pack years: 8.00    Last attempt to quit: 12/30/2004    Years since quitting: 12.9  . Smokeless tobacco: Never Used  . Tobacco comment: in college only  Substance and Sexual Activity  . Alcohol use: Yes    Alcohol/week: 1.0 oz    Types: 2 Standard drinks or equivalent per week    Comment: 2 or less drinks  weekly  . Drug use: No  . Sexual activity: Yes    Partners: Male    Birth control/protection: Surgical  Other Topics Concern  . Not on file  Social History Narrative   Katrianna grew up in [place]. 3 kids. Taylie is currently divorced. She attended [employer] for her Bachelors in Nursing. She then attended [employer] for her Masters in Adult Oncology Nurse Practitioner. She is working at [employer]. She spends all of her spare time with her children and new puppy.      Caffeine - 2 cups of coffee daily then water   Exercise - active   No outpatient medications have been marked as taking for the 12/05/17 encounter (Office Visit) with McLean-Scocuzza, Nino Glow, MD.   Allergies  Allergen Reactions  . Sulfa Antibiotics Anaphylaxis  . Erythromycin Other (See Comments)    Unknown rxn, childhood allergy  . Iron Itching   No results found for this or  any previous visit (from the past 2160 hour(s)). Objective  Body mass index is 24.45 kg/m. Wt Readings from Last 3 Encounters:  12/05/17 151 lb 8 oz (68.7 kg)  06/28/16 176 lb (79.8 kg)  10/13/14 140 lb 8 oz (63.7 kg)   Temp Readings from Last 3 Encounters:  12/05/17 98.9 F (37.2 C) (Oral)  06/28/16 97.8 F (36.6 C) (Oral)  05/02/16 98.2 F (36.8 C) (Oral)   BP Readings from Last 3 Encounters:  12/05/17 122/84  06/28/16 111/66  05/02/16 113/74   Pulse Readings from Last 3 Encounters:  12/05/17 72  06/28/16 92  05/02/16 94   Pulse oximetry on room air is 99% Physical Exam  Constitutional: She is well-developed, well-nourished, and in no distress. Vital signs are normal.  HENT:  Head: Normocephalic and atraumatic.  Mouth/Throat: Oropharynx is clear and moist and mucous membranes are normal.  Eyes: Conjunctivae are normal. Pupils are equal, round, and reactive to light.  Cardiovascular: Normal rate, regular rhythm and normal heart sounds.  Pulmonary/Chest: Effort normal and breath sounds normal.  Abdominal: Soft. Bowel sounds are normal. There is no tenderness.  Neurological: She is alert. Gait normal.  Skin: Skin is warm and dry.  Psychiatric: Mood, memory, affect and judgment normal.  Nursing note and vitals reviewed.  Assessment   1. Bunion left foot  2. H/o ASCUS pap + HPV  3. Constipation with blood in stool  4. HM   Plan  1.  Referred podiatry Dr. Amalia Hailey in [place]  Given info  Disc pads, wider shoes, orthosis maybe podiatry can make  2. Referred [facility] Paul[facility] due 01/2018  Disc HPV vaccine age extended to 45  3. Disc Miralax, prune juice, metamucil doing stool softner 100 bid as well  If blood continues will refer to GI  4 UTD flu 09/2017 and Tdap  Disc HPV rec check Hep B titer if cant find in records   Referred for pap  Reviewed labs 08/22/17 CMET, CBC, TSH, lipid nl  H/o neg Hep C, HIV  Follows with dermatology Dr. Kellie Moor skin  bxs negative in the past   Provider: Dr. Olivia Mackie McLean-Scocuzza

## 2018-01-07 ENCOUNTER — Encounter: Payer: Self-pay | Admitting: Podiatry

## 2018-01-07 ENCOUNTER — Other Ambulatory Visit: Payer: Self-pay | Admitting: Podiatry

## 2018-01-07 ENCOUNTER — Ambulatory Visit (INDEPENDENT_AMBULATORY_CARE_PROVIDER_SITE_OTHER): Payer: No Typology Code available for payment source

## 2018-01-07 ENCOUNTER — Ambulatory Visit: Payer: No Typology Code available for payment source | Admitting: Podiatry

## 2018-01-07 VITALS — BP 104/76 | HR 83 | Resp 16 | Ht 66.0 in | Wt 150.0 lb

## 2018-01-07 DIAGNOSIS — M79671 Pain in right foot: Secondary | ICD-10-CM | POA: Diagnosis not present

## 2018-01-07 DIAGNOSIS — M79672 Pain in left foot: Secondary | ICD-10-CM

## 2018-01-07 DIAGNOSIS — M21622 Bunionette of left foot: Secondary | ICD-10-CM

## 2018-01-07 NOTE — Progress Notes (Signed)
   Subjective:    Patient ID: Suzanne Evans, female    DOB: 01/22/1981, 37 y.o.   MRN: [id number]  HPI    Review of Systems  All other systems reviewed and are negative.      Objective:   Physical Exam        Assessment & Plan:

## 2018-01-07 NOTE — Progress Notes (Signed)
Subjective:   Patient ID: Suzanne Evans, female   DOB: 37 y.o.   MRN: [id number]   HPI Patient presents with painful bunion on the side of the left foot that is making shoe gear difficult and is been present for around 3 years.  Patient states she is tried wider shoes she is tried to soak it at times and feels some better but then other times is very sore and patient does not smoke and likes to be active   Review of Systems  All other systems reviewed and are negative.       Objective:  Physical Exam  Constitutional: She appears well-developed and well-nourished.  Cardiovascular: Intact distal pulses.  Pulmonary/Chest: Effort normal.  Musculoskeletal: Normal range of motion.  Neurological: She is alert.  Skin: Skin is warm.  Nursing note and vitals reviewed.   Neurovascular status intact muscle strength adequate range of motion within normal limits with patient found to have large prominence around the fifth metatarsal head left with redness and pain with palpation and also fluid buildup around the head of the fifth metatarsal.  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  I also offered the chance to drain the area or possibly use cortisone but she refuses this option and she wants surgery.  She would do best with metatarsal osteotomy and I educated her on this and she will decide on her best time and reappoint for consult prior to procedure  X-rays indicate moderate structural deformity around the first metatarsal and fifth metatarsal left over right with the fifth metatarsal left showing quite a bit of fluid accumulation

## 2018-01-08 ENCOUNTER — Telehealth: Payer: Self-pay | Admitting: *Deleted

## 2018-01-08 NOTE — Telephone Encounter (Signed)
"  Dr. Paulla Dolly told me to call you to set up my surgery."  Have you signed consent forms.  I didn't because it was kind of still up in the air."  He can do it on February 12.  You will need to schedule an appointment to see him for a consultation.  Would you like me to transfer you to a scheduler?  "Yes, that would be great."  Call was transferred to a scheduler.

## 2018-01-08 NOTE — Telephone Encounter (Signed)
"  I met with Dr. Paulla Dolly yesterday.  I would like to schedule surgery for a Tailor's Bunion on my left foot.  I was hoping for February 12.  If you could, just give me a call back."

## 2018-01-23 ENCOUNTER — Other Ambulatory Visit: Payer: Self-pay | Admitting: Gynecologic Oncology

## 2018-01-23 DIAGNOSIS — R1900 Intra-abdominal and pelvic swelling, mass and lump, unspecified site: Secondary | ICD-10-CM

## 2018-01-23 NOTE — Progress Notes (Signed)
Evaluated in the office for questionable mass/cyst palpated within left lower quadrant.  On palpation of the abdomen, fullness noted in the LLQ compared with RLQ.  Plan for pelvic ultrasound to evaluate for left ovarian mass/cyst.

## 2018-01-28 ENCOUNTER — Ambulatory Visit (HOSPITAL_COMMUNITY)
Admission: RE | Admit: 2018-01-28 | Discharge: 2018-01-28 | Disposition: A | Discharge status: Home / Self Care | Payer: No Typology Code available for payment source | Source: Ambulatory Visit | Attending: Gynecologic Oncology | Admitting: Gynecologic Oncology

## 2018-01-28 DIAGNOSIS — R1900 Intra-abdominal and pelvic swelling, mass and lump, unspecified site: Secondary | ICD-10-CM | POA: Insufficient documentation

## 2018-02-04 ENCOUNTER — Ambulatory Visit: Payer: No Typology Code available for payment source | Admitting: Podiatry

## 2018-02-04 ENCOUNTER — Encounter: Payer: Self-pay | Admitting: Podiatry

## 2018-02-04 DIAGNOSIS — M21622 Bunionette of left foot: Secondary | ICD-10-CM | POA: Diagnosis not present

## 2018-02-04 NOTE — Patient Instructions (Signed)
Pre-Operative Instructions  Congratulations, you have decided to take an important step towards improving your quality of life.  You can be assured that the doctors and staff at [facility] will be with you every step of the way.  Here are some important things you should know:  1. Plan to be at the surgery center/hospital at least 1 (one) hour prior to your scheduled time, unless otherwise directed by the surgical center/hospital staff.  You must have a responsible adult accompany you, remain during the surgery and drive you home.  Make sure you have directions to the surgical center/hospital to ensure you arrive on time. 2. If you are having surgery at [facility] or [facility] hospitals, you will need a copy of your medical history and physical form from your family physician within one month prior to the date of surgery. We will give you a form for your primary physician to complete.  3. We make every effort to accommodate the date you request for surgery.  However, there are times where surgery dates or times have to be moved.  We will contact you as soon as possible if a change in schedule is required.   4. No aspirin/ibuprofen for one week before surgery.  If you are on aspirin, any non-steroidal anti-inflammatory medications (Mobic, Aleve, Ibuprofen) should not be taken seven (7) days prior to your surgery.  You make take Tylenol for pain prior to surgery.  5. Medications - If you are taking daily heart and blood pressure medications, seizure, reflux, allergy, asthma, anxiety, pain or diabetes medications, make sure you notify the surgery center/hospital before the day of surgery so they can tell you which medications you should take or avoid the day of surgery. 6. No food or drink after midnight the night before surgery unless directed otherwise by surgical center/hospital staff. 7. No alcoholic beverages 24-hours prior to surgery.  No smoking 24-hours prior or 24-hours after  surgery. 8. Wear loose pants or shorts. They should be loose enough to fit over bandages, boots, and casts. 9. Don't wear slip-on shoes. Sneakers are preferred. 10. Bring your boot with you to the surgery center/hospital.  Also bring crutches or a walker if your physician has prescribed it for you.  If you do not have this equipment, it will be provided for you after surgery. 11. If you have not been contacted by the surgery center/hospital by the day before your surgery, call to confirm the date and time of your surgery. 12. Leave-time from work may vary depending on the type of surgery you have.  Appropriate arrangements should be made prior to surgery with your employer. 13. Prescriptions will be provided immediately following surgery by your doctor.  Fill these as soon as possible after surgery and take the medication as directed. Pain medications will not be refilled on weekends and must be approved by the doctor. 14. Remove nail polish on the operative foot and avoid getting pedicures prior to surgery. 15. Wash the night before surgery.  The night before surgery wash the foot and leg well with water and the antibacterial soap provided. Be sure to pay special attention to beneath the toenails and in between the toes.  Wash for at least three (3) minutes. Rinse thoroughly with water and dry well with a towel.  Perform this wash unless told not to do so by your physician.  Enclosed: 1 Ice pack (please put in freezer the night before surgery)   1 Hibiclens skin cleaner     Pre-op instructions  If you have any questions regarding the instructions, please do not hesitate to call our office.  [place]: [address], [place], [place] [address] -- [phone]  [place]: [address] , [place] [address] -- [phone]  [place]: [address]  [place], [place] [address] -- [phone]  [place]: [address], [address], [place], [place] [address] -- [phone]  Website:  [email or web address] 

## 2018-02-04 NOTE — Progress Notes (Signed)
Subjective:   Patient ID: Suzanne Evans, female   DOB: 37 y.o.   MRN: [id number]   HPI Patient presents stating that she has inflammation and pain around the fifth metatarsal left and she is ready for surgery   ROS      Objective:  Physical Exam  Neurovascular status intact with inflammation pain around the fifth metatarsal left with enlargement of the area that may be cyst also in nature     Assessment:  Tailor's bunion deformity left with possibility also that there may be some form of cyst formation     Plan:  H&P and x-rays reviewed with patient and husband.  I recommended osteotomy with exploration of the area and possible removal of cyst.  I allow patient to read consent form and explained all possible complications as outlined and patient is willing to accept risks understanding alternative treatment complications.  Patient understands total recovery can take 6 months to 1 year and at this time is scheduled for outpatient surgery for this condition.  Air fracture walker dispensed with all instructions on usage

## 2018-02-10 ENCOUNTER — Encounter: Payer: Self-pay | Admitting: Podiatry

## 2018-02-10 DIAGNOSIS — M21542 Acquired clubfoot, left foot: Secondary | ICD-10-CM | POA: Diagnosis not present

## 2018-02-10 DIAGNOSIS — D492 Neoplasm of unspecified behavior of bone, soft tissue, and skin: Secondary | ICD-10-CM | POA: Diagnosis not present

## 2018-02-12 ENCOUNTER — Telehealth: Payer: Self-pay | Admitting: *Deleted

## 2018-02-12 NOTE — Telephone Encounter (Signed)
Called and spoke with the patient and the patient stated that she was doing good and there was no fever, chills, nausea and patient is elevating and has stopped the pain medicine due to it making patient constipated and just using ibuprofen and I stated to call the office if any concerns or questions. Lattie Haw

## 2018-02-16 ENCOUNTER — Ambulatory Visit (INDEPENDENT_AMBULATORY_CARE_PROVIDER_SITE_OTHER): Payer: No Typology Code available for payment source

## 2018-02-16 ENCOUNTER — Encounter: Payer: Self-pay | Admitting: Podiatry

## 2018-02-16 ENCOUNTER — Ambulatory Visit (INDEPENDENT_AMBULATORY_CARE_PROVIDER_SITE_OTHER): Payer: No Typology Code available for payment source | Admitting: Podiatry

## 2018-02-16 DIAGNOSIS — M21622 Bunionette of left foot: Secondary | ICD-10-CM

## 2018-02-16 NOTE — Progress Notes (Signed)
Subjective:   Patient ID: Suzanne Evans, female   DOB: 37 y.o.   MRN: [id number]   HPI Patient presents stating she is doing well did have some pain was not able to take the pain pills but has been utilizing ibuprofen   ROS      Objective:  Physical Exam  Neurovascular status intact negative Homans sign was noted with patient's left foot healing well with wound edges well coapted fifth metatarsal in good alignment and significant diminishment of the tissue around the fifth metatarsal head     Assessment:  Doing well post osteotomy left removal of soft tissue mass left     Plan:  H&P condition reviewed and x-ray reviewed with patient.  Applied sterile dressing advised on continued immobilization and dispensed surgical shoe and reappoint 3 weeks or earlier if needed

## 2018-02-18 ENCOUNTER — Other Ambulatory Visit: Payer: No Typology Code available for payment source

## 2018-02-18 LAB — HM PAP SMEAR: HM PAP: ABNORMAL

## 2018-02-19 ENCOUNTER — Encounter: Payer: Self-pay | Admitting: Podiatry

## 2018-03-06 ENCOUNTER — Telehealth: Payer: Self-pay | Admitting: Podiatry

## 2018-03-06 NOTE — Telephone Encounter (Signed)
I had surgery back on 12 February and I still have not gotten the results of my pathology test. I placed the pt on hold and spoke to our triage nurse [facility]. Marcy Siren told me to tell the pt that she cannot give her the results and that Dr. Paulla Dolly has been out of the office this week. I told her he would go over the results with her once he reviewed the results. I then told her since she has an appointment with him this coming Wednesday 13 March at 4:15 pm he would go over her results with her then.

## 2018-03-11 ENCOUNTER — Ambulatory Visit (INDEPENDENT_AMBULATORY_CARE_PROVIDER_SITE_OTHER): Payer: No Typology Code available for payment source

## 2018-03-11 ENCOUNTER — Ambulatory Visit (INDEPENDENT_AMBULATORY_CARE_PROVIDER_SITE_OTHER): Payer: No Typology Code available for payment source | Admitting: Podiatry

## 2018-03-11 ENCOUNTER — Encounter: Payer: Self-pay | Admitting: Podiatry

## 2018-03-11 DIAGNOSIS — M21622 Bunionette of left foot: Secondary | ICD-10-CM

## 2018-03-11 DIAGNOSIS — M79671 Pain in right foot: Secondary | ICD-10-CM

## 2018-03-11 DIAGNOSIS — M79672 Pain in left foot: Secondary | ICD-10-CM

## 2018-03-12 NOTE — Progress Notes (Signed)
Subjective:   Patient ID: Suzanne Evans, female   DOB: 37 y.o.   MRN: [id number]   HPI Patient presents stating left foot is doing very well and she wanted to review pathology   ROS      Objective:  Physical Exam  Neurovascular status intact with patient's left foot healing well with wound edges well coapted and alignment good of the fifth metatarsal with satisfactory resection of soft tissue mass     Assessment:  Doing well overall     Plan:  Reviewed that mass was benign and that osteotomy is healing well and reviewed x-rays and continue with immobilization with gradual increase in activities over the next 4 weeks and dispensed a compression stocking  X-ray indicates osteotomy is healing well screws in place with good alignment noted

## 2018-03-23 ENCOUNTER — Encounter: Payer: Self-pay | Admitting: Podiatry

## 2018-03-23 ENCOUNTER — Ambulatory Visit (INDEPENDENT_AMBULATORY_CARE_PROVIDER_SITE_OTHER): Payer: No Typology Code available for payment source

## 2018-03-23 ENCOUNTER — Telehealth: Payer: Self-pay | Admitting: Podiatry

## 2018-03-23 ENCOUNTER — Ambulatory Visit (INDEPENDENT_AMBULATORY_CARE_PROVIDER_SITE_OTHER): Payer: No Typology Code available for payment source | Admitting: Podiatry

## 2018-03-23 DIAGNOSIS — M21622 Bunionette of left foot: Secondary | ICD-10-CM

## 2018-03-23 NOTE — Telephone Encounter (Signed)
Hey. I'm calling because I'm pretty sure my foot is infected where I had surgery with Dr. Paulla Dolly. I need to be seen today. It's gotten worse pretty quick so I don't want it to be twice as bad if it gets delayed. If you could please give me a call back at [phone] my office number is [phone] I would greatly appreciate it. Thank you.

## 2018-03-23 NOTE — Telephone Encounter (Signed)
Pt states she was with her family yesterday and the area is red, swollen, open and draining. I told pt I felt we could get her in today at 4:30pm. I transferred to schedulers.

## 2018-03-27 NOTE — Progress Notes (Signed)
Subjective:   Patient ID: Suzanne Evans, female   DOB: 37 y.o.   MRN: [id number]   HPI Patient presents stating she is doing well with the left foot with minimal discomfort and able to wear shoes at the current time   ROS      Objective:  Physical Exam  Neurovascular status intact negative Homans sign noted with patient's left foot healing well with wound edges well coapted and no indications of deformity current     Assessment:  Doing well post metatarsal osteotomy excision of mass from the left fifth metatarsal     Plan:  Reviewed x-rays allow patient to return to more activity and gradual increase but continue with rigid bottom shoes and no jumping on the foot  X-ray indicates osteotomy is healing well with good alignment and fixation and no indications of reoccurrence of lesion or mass

## 2018-06-05 ENCOUNTER — Ambulatory Visit: Payer: No Typology Code available for payment source | Admitting: Internal Medicine

## 2018-06-11 ENCOUNTER — Ambulatory Visit (INDEPENDENT_AMBULATORY_CARE_PROVIDER_SITE_OTHER): Payer: No Typology Code available for payment source | Admitting: Internal Medicine

## 2018-06-11 ENCOUNTER — Encounter: Payer: Self-pay | Admitting: Internal Medicine

## 2018-06-11 VITALS — BP 110/70 | HR 78 | Temp 98.6°F | Ht 66.0 in | Wt 156.0 lb

## 2018-06-11 DIAGNOSIS — R252 Cramp and spasm: Secondary | ICD-10-CM

## 2018-06-11 DIAGNOSIS — M79605 Pain in left leg: Secondary | ICD-10-CM

## 2018-06-11 LAB — D-DIMER, QUANTITATIVE (NOT AT ARMC): D DIMER QUANT: 0.25 ug{FEU}/mL (ref <0.50)

## 2018-06-11 NOTE — Patient Instructions (Addendum)
Try tonic water Sweeps with quinine Magnexium oxide 400 mg daily  Heat, stretching and massage   Leg Cramps Leg cramps occur when a muscle or muscles tighten and you have no control over this tightening (involuntary muscle contraction). Muscle cramps can develop in any muscle, but the most common place is in the calf muscles of the leg. Those cramps can occur during exercise or when you are at rest. Leg cramps are painful, and they may last for a few seconds to a few minutes. Cramps may return several times before they finally stop. Usually, leg cramps are not caused by a serious medical problem. In many cases, the cause is not known. Some common causes include:  Overexertion.  Overuse from repetitive motions, or doing the same thing over and over.  Remaining in a certain position for a long period of time.  Improper preparation, form, or technique while performing a sport or an activity.  Dehydration.  Injury.  Side effects of some medicines.  Abnormally low levels of the salts and ions in your blood (electrolytes), especially potassium and calcium. These levels could be low if you are taking water pills (diuretics) or if you are pregnant.  Follow these instructions at home: Watch your condition for any changes. Taking the following actions may help to lessen any discomfort that you are feeling:  Stay well-hydrated. Drink enough fluid to keep your urine clear or pale yellow.  Try massaging, stretching, and relaxing the affected muscle. Do this for several minutes at a time.  For tight or tense muscles, use a warm towel, heating pad, or hot shower water directed to the affected area.  If you are sore or have pain after a cramp, applying ice to the affected area may relieve discomfort. ? Put ice in a plastic bag. ? Place a towel between your skin and the bag. ? Leave the ice on for 20 minutes, 2-3 times per day.  Avoid strenuous exercise for several days if you have been having  frequent leg cramps.  Make sure that your diet includes the essential minerals for your muscles to work normally.  Take medicines only as directed by your health care provider.  Contact a health care provider if:  Your leg cramps get more severe or more frequent, or they do not improve over time.  Your foot becomes cold, numb, or blue. This information is not intended to replace advice given to you by your health care provider. Make sure you discuss any questions you have with your health care provider. Document Released: 01/23/2005 Document Revised: 05/23/2016 Document Reviewed: 11/23/2014 Elsevier Interactive Patient Education  [employer].

## 2018-06-11 NOTE — Progress Notes (Signed)
Pre visit review using our clinic review tool, if applicable. No additional management support is needed unless otherwise documented below in the visit note. 

## 2018-06-12 ENCOUNTER — Encounter: Payer: Self-pay | Admitting: Internal Medicine

## 2018-06-12 LAB — BASIC METABOLIC PANEL
BUN: 12 mg/dL (ref 6–23)
CALCIUM: 9.6 mg/dL (ref 8.4–10.5)
CO2: 28 meq/L (ref 19–32)
Chloride: 101 mEq/L (ref 96–112)
Creatinine, Ser: 0.8 mg/dL (ref 0.40–1.20)
GFR: 86.86 mL/min (ref >60.00)
GLUCOSE: 74 mg/dL (ref 70–99)
POTASSIUM: 3.9 meq/L (ref 3.5–5.1)
SODIUM: 139 meq/L (ref 135–145)

## 2018-06-12 NOTE — Progress Notes (Signed)
Chief Complaint  Patient presents with  . Leg Pain    left calf cramping.    . Constipation   Acute visit  1. She had cyst and bunion surgery 01/2018 and for the past 3 weeks c/o left lower leg/calf pain and cramping tried Advil, Tylenol w/o help. Pain is improved but intermittent and random no specific trigger I.e whether she is sitting or standing. She reports initially after surgery she had swelling in her foot but resolved now. She is drinking 2-3 L water per day. She has not tried heat and she tries to rub the area sometimes She wanted to get this checked out and was concerned.   Review of Systems  HENT: Negative for hearing loss.   Eyes: Negative for blurred vision.  Respiratory: Negative for shortness of breath.   Cardiovascular: Negative for chest pain.  Musculoskeletal:       +left lower leg calf pain and cramping    Past Medical History:  Diagnosis Date  . Abnormal Pap smear of cervix    ASCUS, +HPV  . Bunion, left foot   . Chicken pox   . Constipation   . Genital herpes    Past Surgical History:  Procedure Laterality Date  . APPENDECTOMY  2003  . TUBAL LIGATION N/A 06/28/2016   Procedure: POST PARTUM TUBAL LIGATION;  Surgeon: Boykin Nearing, MD;  Location: [facility] ORS;  Service: Gynecology;  Laterality: N/A;   Family History  Problem Relation Age of Onset  . Heart disease Maternal Grandmother   . Hyperlipidemia Maternal Grandmother   . Stroke Maternal Grandmother   . Hyperlipidemia Mother   . Obesity Mother   . Alcohol abuse Paternal Grandfather   . Obesity Father   . Obesity Sister   . Obesity Brother   . Lung cancer Maternal Grandfather        smoker  . Colon cancer Neg Hx   . Colitis Neg Hx   . Crohn's disease Neg Hx   . Diabetes Neg Hx   . Kidney disease Neg Hx   . Liver disease Neg Hx    Social History   Socioeconomic History  . Marital status: Divorced    Spouse name: Not on file  . Number of children: 2  . Years of education: 55  .  Highest education level: Not on file  Occupational History  . Occupation: Designer, jewellery    Comment: [place] at [employer]  . Financial resource strain: Not on file  . Food insecurity:    Worry: Not on file    Inability: Not on file  . Transportation needs:    Medical: Not on file    Non-medical: Not on file  Tobacco Use  . Smoking status: Former Smoker    Packs/day: 1.00    Years: 8.00    Pack years: 8.00    Last attempt to quit: 12/30/2004    Years since quitting: 13.4  . Smokeless tobacco: Never Used  . Tobacco comment: in college only  Substance and Sexual Activity  . Alcohol use: Yes    Alcohol/week: 1.2 oz    Types: 2 Standard drinks or equivalent per week    Comment: 2 or less drinks weekly  . Drug use: No  . Sexual activity: Yes    Partners: Male    Birth control/protection: Surgical  Lifestyle  . Physical activity:    Days per week: Not on file    Minutes per session: Not on file  .  Stress: Not on file  Relationships  . Social connections:    Talks on phone: Not on file    Gets together: Not on file    Attends religious service: Not on file    Active member of club or organization: Not on file    Attends meetings of clubs or organizations: Not on file    Relationship status: Not on file  . Intimate partner violence:    Fear of current or ex partner: Not on file    Emotionally abused: Not on file    Physically abused: Not on file    Forced sexual activity: Not on file  Other Topics Concern  . Not on file  Social History Narrative   Rayn grew up in [place]. 3 kids. Lashawna is currently divorced. She attended [employer] for her Bachelors in Nursing. She then attended [employer] for her Masters in Adult Oncology Nurse Practitioner. She is working at [employer]. She spends all of her spare time with her children and new puppy.      Caffeine - 2 cups of coffee daily then water   Exercise - active   Current Meds    Medication Sig  . Ascorbic Acid (VITAMIN C PO) Take by mouth. Pt takes liquid  . ELDERBERRY PO Take by mouth. Pt takes liquid   Allergies  Allergen Reactions  . Sulfa Antibiotics Anaphylaxis  . Erythromycin Other (See Comments)    Unknown rxn, childhood allergy  . Iron Itching   Recent Results (from the past 2160 hour(s))  Basic Metabolic Panel (BMET)     Status: None   Collection Time: 06/11/18  4:18 PM  Result Value Ref Range   Sodium 139 135 - 145 mEq/L   Potassium 3.9 3.5 - 5.1 mEq/L   Chloride 101 96 - 112 mEq/L   CO2 28 19 - 32 mEq/L   Glucose, Bld 74 70 - 99 mg/dL   BUN 12 6 - 23 mg/dL   Creatinine, Ser 0.80 0.40 - 1.20 mg/dL   Calcium 9.6 8.4 - 10.5 mg/dL   GFR 86.86 >60.00 mL/min  D-Dimer, Quantitative     Status: None   Collection Time: 06/11/18  4:18 PM  Result Value Ref Range   D-Dimer, Quant 0.25 <0.50 mcg/mL FEU    Comment: . The D-Dimer test is used frequently to exclude an acute PE or DVT. In patients with a low to moderate clinical risk assessment and a D-Dimer result <0.50 mcg/mL FEU, the likelihood of a PE or DVT is very low. However, a thromboembolic event should not be excluded solely on the basis of the D-Dimer level. Increased levels of D-Dimer are associated with a PE, DVT, DIC, malignancies, inflammation, sepsis, surgery, trauma, pregnancy, and advancing patient age. [Jama 2006 11:295(2):199-207] . For additional information, please refer to: http://education.questdiagnostics.com/faq/FAQ149 (This link is being provided for informational/ educational purposes only) .    Objective  Body mass index is 25.18 kg/m. Wt Readings from Last 3 Encounters:  06/11/18 156 lb (70.8 kg)  01/07/18 150 lb (68 kg)  12/05/17 151 lb 8 oz (68.7 kg)   Temp Readings from Last 3 Encounters:  06/11/18 98.6 F (37 C) (Oral)  12/05/17 98.9 F (37.2 C) (Oral)  06/28/16 97.8 F (36.6 C) (Oral)   BP Readings from Last 3 Encounters:  06/11/18 110/70   01/07/18 104/76  12/05/17 122/84   Pulse Readings from Last 3 Encounters:  06/11/18 78  01/07/18 83  12/05/17 72  Physical Exam  Constitutional: She is oriented to person, place, and time. Vital signs are normal. She appears well-developed and well-nourished. She is cooperative.  HENT:  Head: Normocephalic and atraumatic.  Mouth/Throat: Oropharynx is clear and moist and mucous membranes are normal.  Eyes: Pupils are equal, round, and reactive to light. Conjunctivae are normal.  Cardiovascular: Normal rate, regular rhythm and normal heart sounds.  Pulmonary/Chest: Effort normal and breath sounds normal.  Musculoskeletal:       Legs: Neurological: She is alert and oriented to person, place, and time. Gait normal.  Skin: Skin is warm, dry and intact.  Psychiatric: She has a normal mood and affect. Her speech is normal and behavior is normal. Judgment and thought content normal. Cognition and memory are normal.  Nursing note and vitals reviewed.   Assessment   1. Left lower leg cramping and pain Ddx MSK r/o DVT though low suspicion  Plan  1. D dimer and BMET today  Trial of heat, stretching, massage, continue hydration, can try tonic water with quinnine, magnesium 400 mg qd to see if helps If does not get better let me know will reeval at that time  Check more comprehensive labs at f/u  Provider: Dr. Olivia Mackie McLean-Scocuzza-Internal Medicine

## 2018-07-17 ENCOUNTER — Ambulatory Visit (INDEPENDENT_AMBULATORY_CARE_PROVIDER_SITE_OTHER): Payer: No Typology Code available for payment source | Admitting: Internal Medicine

## 2018-07-17 ENCOUNTER — Encounter: Payer: Self-pay | Admitting: Internal Medicine

## 2018-07-17 VITALS — BP 112/58 | HR 82 | Temp 98.8°F | Ht 66.0 in | Wt 153.6 lb

## 2018-07-17 DIAGNOSIS — F4310 Post-traumatic stress disorder, unspecified: Secondary | ICD-10-CM

## 2018-07-17 DIAGNOSIS — R490 Dysphonia: Secondary | ICD-10-CM | POA: Insufficient documentation

## 2018-07-17 DIAGNOSIS — F419 Anxiety disorder, unspecified: Secondary | ICD-10-CM | POA: Insufficient documentation

## 2018-07-17 DIAGNOSIS — F410 Panic disorder [episodic paroxysmal anxiety] without agoraphobia: Secondary | ICD-10-CM | POA: Diagnosis not present

## 2018-07-17 DIAGNOSIS — Z1389 Encounter for screening for other disorder: Secondary | ICD-10-CM

## 2018-07-17 DIAGNOSIS — Z13220 Encounter for screening for lipoid disorders: Secondary | ICD-10-CM | POA: Diagnosis not present

## 2018-07-17 DIAGNOSIS — Z1329 Encounter for screening for other suspected endocrine disorder: Secondary | ICD-10-CM | POA: Diagnosis not present

## 2018-07-17 NOTE — Progress Notes (Signed)
Chief Complaint  Patient presents with  . Follow-up   F/u with son Suzanne Evans today  1. C/o anxiety/panic and ptsd due to previous marital issues with Ex husband and custody battle though she has main custody still having to go to court with her ex PH9 score 10 today and GAD 7 12 today and having hard time with concentration, sob, worry, increased appetite. Tried zoloft and effexor in the past. zoloft made her apathetic. Does not want to do medications at times time but wants Dr. Ardyth Man referral and insight. She takes 2/4 kids to therapy at [facility] and she may look into therapy there if they have therapy for adults.  2. Leg pain resolved  3. Constipation improved now that taking magnesium 400 mg qd  4. C/o hoarse voice x 3 months noticed 1x after had URI and then another time. She does take occasional prevacid for GERD    Review of Systems  Constitutional: Negative for weight loss.  Respiratory: Positive for shortness of breath.   Cardiovascular: Negative for chest pain.  Musculoskeletal: Negative for joint pain.  Psychiatric/Behavioral: Negative for depression. The patient is nervous/anxious.        +stress    Past Medical History:  Diagnosis Date  . Abnormal Pap smear of cervix    ASCUS, +HPV  . Bunion, left foot   . Chicken pox   . Constipation   . Genital herpes    Past Surgical History:  Procedure Laterality Date  . APPENDECTOMY  2003  . TUBAL LIGATION N/A 06/28/2016   Procedure: POST PARTUM TUBAL LIGATION;  Surgeon: Boykin Nearing, MD;  Location: [facility] ORS;  Service: Gynecology;  Laterality: N/A;   Family History  Problem Relation Age of Onset  . Heart disease Maternal Grandmother   . Hyperlipidemia Maternal Grandmother   . Stroke Maternal Grandmother   . Hyperlipidemia Mother   . Obesity Mother   . Alcohol abuse Paternal Grandfather   . Obesity Father   . Obesity Sister   . Obesity Brother   . Lung cancer Maternal Grandfather        smoker  . Colon cancer Neg  Hx   . Colitis Neg Hx   . Crohn's disease Neg Hx   . Diabetes Neg Hx   . Kidney disease Neg Hx   . Liver disease Neg Hx    Social History   Socioeconomic History  . Marital status: Divorced    Spouse name: Not on file  . Number of children: 2  . Years of education: 44  . Highest education level: Not on file  Occupational History  . Occupation: Designer, jewellery    Comment: [place] at [employer]  . Financial resource strain: Not on file  . Food insecurity:    Worry: Not on file    Inability: Not on file  . Transportation needs:    Medical: Not on file    Non-medical: Not on file  Tobacco Use  . Smoking status: Former Smoker    Packs/day: 1.00    Years: 8.00    Pack years: 8.00    Last attempt to quit: 12/30/2004    Years since quitting: 13.5  . Smokeless tobacco: Never Used  . Tobacco comment: in college only  Substance and Sexual Activity  . Alcohol use: Yes    Alcohol/week: 1.2 oz    Types: 2 Standard drinks or equivalent per week    Comment: 2 or less drinks weekly  .  Drug use: No  . Sexual activity: Yes    Partners: Male    Birth control/protection: Surgical  Lifestyle  . Physical activity:    Days per week: Not on file    Minutes per session: Not on file  . Stress: Not on file  Relationships  . Social connections:    Talks on phone: Not on file    Gets together: Not on file    Attends religious service: Not on file    Active member of club or organization: Not on file    Attends meetings of clubs or organizations: Not on file    Relationship status: Not on file  . Intimate partner violence:    Fear of current or ex partner: Not on file    Emotionally abused: Not on file    Physically abused: Not on file    Forced sexual activity: Not on file  Other Topics Concern  . Not on file  Social History Narrative   Suzanne Evans grew up in [place]. 3 kids. Suzanne Evans is currently divorced. She attended [employer] for her Bachelors in  Nursing. She then attended [employer] for her Masters in Adult Oncology Nurse Practitioner. She is working at [employer]. She spends all of her spare time with her children and new puppy.      Caffeine - 2 cups of coffee daily then water   Exercise - active   Current Meds  Medication Sig  . Ascorbic Acid (VITAMIN C PO) Take by mouth. Pt takes liquid  . Cholecalciferol (VITAMIN D3) 3000 units TABS Take by mouth daily.  Marland Kitchen ELDERBERRY PO Take by mouth. Pt takes liquid  . Magnesium 400 MG CAPS Take by mouth daily.  . Multiple Vitamins-Minerals (ZINC PO) Take by mouth daily.  . Prenatal Vit w/Fe-Methylfol-FA (TL FOLATE PO) Take by mouth.  . vitamin B-12 (CYANOCOBALAMIN) 1000 MCG tablet Take 1,000 mcg by mouth daily.  Marland Kitchen VITAMIN E PO Take by mouth daily.   Allergies  Allergen Reactions  . Sulfa Antibiotics Anaphylaxis  . Erythromycin Other (See Comments)    Unknown rxn, childhood allergy  . Iron Itching   Recent Results (from the past 2160 hour(s))  Basic Metabolic Panel (BMET)     Status: None   Collection Time: 06/11/18  4:18 PM  Result Value Ref Range   Sodium 139 135 - 145 mEq/L   Potassium 3.9 3.5 - 5.1 mEq/L   Chloride 101 96 - 112 mEq/L   CO2 28 19 - 32 mEq/L   Glucose, Bld 74 70 - 99 mg/dL   BUN 12 6 - 23 mg/dL   Creatinine, Ser 0.80 0.40 - 1.20 mg/dL   Calcium 9.6 8.4 - 10.5 mg/dL   GFR 86.86 >60.00 mL/min  D-Dimer, Quantitative     Status: None   Collection Time: 06/11/18  4:18 PM  Result Value Ref Range   D-Dimer, Quant 0.25 <0.50 mcg/mL FEU    Comment: . The D-Dimer test is used frequently to exclude an acute PE or DVT. In patients with a low to moderate clinical risk assessment and a D-Dimer result <0.50 mcg/mL FEU, the likelihood of a PE or DVT is very low. However, a thromboembolic event should not be excluded solely on the basis of the D-Dimer level. Increased levels of D-Dimer are associated with a PE, DVT, DIC, malignancies, inflammation, sepsis,  surgery, trauma, pregnancy, and advancing patient age. [Jama 2006 11:295(2):199-207] . For additional information, please refer to: http://education.questdiagnostics.com/faq/FAQ149 (This link is  being provided for informational/ educational purposes only) .    Objective  Body mass index is 24.79 kg/m. Wt Readings from Last 3 Encounters:  07/17/18 153 lb 9.6 oz (69.7 kg)  06/11/18 156 lb (70.8 kg)  01/07/18 150 lb (68 kg)   Temp Readings from Last 3 Encounters:  07/17/18 98.8 F (37.1 C) (Oral)  06/11/18 98.6 F (37 C) (Oral)  12/05/17 98.9 F (37.2 C) (Oral)   BP Readings from Last 3 Encounters:  07/17/18 (!) 112/58  06/11/18 110/70  01/07/18 104/76   Pulse Readings from Last 3 Encounters:  07/17/18 82  06/11/18 78  01/07/18 83    Physical Exam  Constitutional: She is oriented to person, place, and time. Vital signs are normal. She appears well-developed and well-nourished. She is cooperative.  HENT:  Head: Normocephalic and atraumatic.  Mouth/Throat: Oropharynx is clear and moist and mucous membranes are normal.  Eyes: Pupils are equal, round, and reactive to light. Conjunctivae are normal.  Cardiovascular: Normal rate, regular rhythm and normal heart sounds.  Pulmonary/Chest: Effort normal and breath sounds normal.  Neurological: She is alert and oriented to person, place, and time. Gait normal.  Skin: Skin is warm, dry and intact.  Psychiatric: She has a normal mood and affect. Her speech is normal and behavior is normal. Judgment and thought content normal. Cognition and memory are normal.  Nursing note and vitals reviewed.   Assessment   1. Anxiety/panic/ptsd GAD 7 12 today and PHQ 9 10 though anxiety seems more prevalent  2. Constipation  3. Hoarse voice ? Related to GERD vs other etiology  4. HM Plan   1. Refer to Dr. Nicolasa Ducking  Wants to hold on meds for now see hpi  Will let me know if needs referral to therapy will try insight and given lists of female  therapists in the area  2. Disc otc meds/increase fiber current no issues  3. Will refer to Dr. Sonny Dandy ENT may need further w/u eval of vocal cord irritation with laryngoscopy  4.  UTD flu 09/2017 and Tdap  Disc HPV Hep B immune and had MMR vaccine   Ob/gyn  Saw 02/18/18 [facility] HPV negative h/o HPV and colposcopy 09/03/16 for lgsil f/u appt 08/24/18 Dr. Ouida Sills  Reviewed labs 08/22/17 CMET, CBC, TSH, lipid nl -repeat 08/24/18 lfts, cbc, lipid, tsh, t4, UA (h/o abnormal tfts in pregnancy) H/o neg Hep C, HIV  Follows with dermatology Dr. Kellie Moor skin bxs negative in the past due to see 02/2019     Provider: Dr. Olivia Mackie McLean-Scocuzza-Internal Medicine

## 2018-07-17 NOTE — Patient Instructions (Addendum)
Fasting x 12 hours for labs 08/24/18  Referred to Dr. Nicolasa Ducking  Let me know if you need referral to therapy  [facility] Dr. Richardson Landry voice hoarse  F/u in 4 months    Living With Anxiety After being diagnosed with an anxiety disorder, you may be relieved to know why you have felt or behaved a certain way. It is natural to also feel overwhelmed about the treatment ahead and what it will mean for your life. With care and support, you can manage this condition and recover from it. How to cope with anxiety Dealing with stress Stress is your body's reaction to life changes and events, both good and bad. Stress can last just a few hours or it can be ongoing. Stress can play a major role in anxiety, so it is important to learn both how to cope with stress and how to think about it differently. Talk with your health care provider or a counselor to learn more about stress reduction. He or she may suggest some stress reduction techniques, such as:  Music therapy. This can include creating or listening to music that you enjoy and that inspires you.  Mindfulness-based meditation. This involves being aware of your normal breaths, rather than trying to control your breathing. It can be done while sitting or walking.  Centering prayer. This is a kind of meditation that involves focusing on a word, phrase, or sacred image that is meaningful to you and that brings you peace.  Deep breathing. To do this, expand your stomach and inhale slowly through your nose. Hold your breath for 3-5 seconds. Then exhale slowly, allowing your stomach muscles to relax.  Self-talk. This is a skill where you identify thought patterns that lead to anxiety reactions and correct those thoughts.  Muscle relaxation. This involves tensing muscles then relaxing them.  Choose a stress reduction technique that fits your lifestyle and personality. Stress reduction techniques take time and practice. Set aside 5-15 minutes a day to do them.  Therapists can offer training in these techniques. The training may be covered by some insurance plans. Other things you can do to manage stress include:  Keeping a stress diary. This can help you learn what triggers your stress and ways to control your response.  Thinking about how you respond to certain situations. You may not be able to control everything, but you can control your reaction.  Making time for activities that help you relax, and not feeling guilty about spending your time in this way.  Therapy combined with coping and stress-reduction skills provides the best chance for successful treatment. Medicines Medicines can help ease symptoms. Medicines for anxiety include:  Anti-anxiety drugs.  Antidepressants.  Beta-blockers.  Medicines may be used as the main treatment for anxiety disorder, along with therapy, or if other treatments are not working. Medicines should be prescribed by a health care provider. Relationships Relationships can play a big part in helping you recover. Try to spend more time connecting with trusted friends and family members. Consider going to couples counseling, taking family education classes, or going to family therapy. Therapy can help you and others better understand the condition. How to recognize changes in your condition Everyone has a different response to treatment for anxiety. Recovery from anxiety happens when symptoms decrease and stop interfering with your daily activities at home or work. This may mean that you will start to:  Have better concentration and focus.  Sleep better.  Be less irritable.  Have more energy.  Have improved memory.  It is important to recognize when your condition is getting worse. Contact your health care provider if your symptoms interfere with home or work and you do not feel like your condition is improving. Where to find help and support: You can get help and support from these sources:  Self-help  groups.  Online and [employer].  A trusted spiritual leader.  Couples counseling.  Family education classes.  Family therapy.  Follow these instructions at home:  Eat a healthy diet that includes plenty of vegetables, fruits, whole grains, low-fat dairy products, and lean protein. Do not eat a lot of foods that are high in solid fats, added sugars, or salt.  Exercise. Most adults should do the following: ? Exercise for at least 150 minutes each week. The exercise should increase your heart rate and make you sweat (moderate-intensity exercise). ? Strengthening exercises at least twice a week.  Cut down on caffeine, tobacco, alcohol, and other potentially harmful substances.  Get the right amount and quality of sleep. Most adults need 7-9 hours of sleep each night.  Make choices that simplify your life.  Take over-the-counter and prescription medicines only as told by your health care provider.  Avoid caffeine, alcohol, and certain over-the-counter cold medicines. These may make you feel worse. Ask your pharmacist which medicines to avoid.  Keep all follow-up visits as told by your health care provider. This is important. Questions to ask your health care provider  Would I benefit from therapy?  How often should I follow up with a health care provider?  How long do I need to take medicine?  Are there any long-term side effects of my medicine?  Are there any alternatives to taking medicine? Contact a health care provider if:  You have a hard time staying focused or finishing daily tasks.  You spend many hours a day feeling worried about everyday life.  You become exhausted by worry.  You start to have headaches, feel tense, or have nausea.  You urinate more than normal.  You have diarrhea. Get help right away if:  You have a racing heart and shortness of breath.  You have thoughts of hurting yourself or others. If you ever feel like you may hurt  yourself or others, or have thoughts about taking your own life, get help right away. You can go to your nearest emergency department or call:  Your local emergency services (911 in the [place]  A suicide crisis helpline, such as the [place] at 501-387-8996. This is open 24-hours a day.  Summary  Taking steps to deal with stress can help calm you.  Medicines cannot cure anxiety disorders, but they can help ease symptoms.  Family, friends, and partners can play a big part in helping you recover from an anxiety disorder. This information is not intended to replace advice given to you by your health care provider. Make sure you discuss any questions you have with your health care provider. Document Released: 12/10/2016 Document Revised: 12/10/2016 Document Reviewed: 12/10/2016 Elsevier Interactive Patient Education  2018 [employer].   Constipation, Adult Constipation is when a person has fewer bowel movements in a week than normal, has difficulty having a bowel movement, or has stools that are dry, hard, or larger than normal. Constipation may be caused by an underlying condition. It may become worse with age if a person takes certain medicines and does not take in enough fluids. Follow these instructions at home: Eating and drinking  Eat foods that have a lot of fiber, such as fresh fruits and vegetables, whole grains, and beans.  Limit foods that are high in fat, low in fiber, or overly processed, such as french fries, hamburgers, cookies, candies, and soda.  Drink enough fluid to keep your urine clear or pale yellow. General instructions  Exercise regularly or as told by your health care provider.  Go to the restroom when you have the urge to go. Do not hold it in.  Take over-the-counter and prescription medicines only as told by your health care provider. These include any fiber supplements.  Practice pelvic floor retraining exercises, such as  deep breathing while relaxing the lower abdomen and pelvic floor relaxation during bowel movements.  Watch your condition for any changes.  Keep all follow-up visits as told by your health care provider. This is important. Contact a health care provider if:  You have pain that gets worse.  You have a fever.  You do not have a bowel movement after 4 days.  You vomit.  You are not hungry.  You lose weight.  You are bleeding from the anus.  You have thin, pencil-like stools. Get help right away if:  You have a fever and your symptoms suddenly get worse.  You leak stool or have blood in your stool.  Your abdomen is bloated.  You have severe pain in your abdomen.  You feel dizzy or you faint. This information is not intended to replace advice given to you by your health care provider. Make sure you discuss any questions you have with your health care provider. Document Released: 09/13/2004 Document Revised: 07/05/2016 Document Reviewed: 06/05/2016 Elsevier Interactive Patient Education  2018 [employer].

## 2018-07-17 NOTE — Progress Notes (Signed)
Pre visit review using our clinic review tool, if applicable. No additional management support is needed unless otherwise documented below in the visit note. 

## 2018-08-20 MED FILL — VYVANSE 10 MG CAPSULE: 10 | 30 days supply | Qty: 30 | Fill #0

## 2018-08-21 ENCOUNTER — Other Ambulatory Visit (INDEPENDENT_AMBULATORY_CARE_PROVIDER_SITE_OTHER): Payer: No Typology Code available for payment source

## 2018-08-21 DIAGNOSIS — Z1329 Encounter for screening for other suspected endocrine disorder: Secondary | ICD-10-CM | POA: Diagnosis not present

## 2018-08-21 DIAGNOSIS — Z13220 Encounter for screening for lipoid disorders: Secondary | ICD-10-CM

## 2018-08-21 DIAGNOSIS — F419 Anxiety disorder, unspecified: Secondary | ICD-10-CM | POA: Diagnosis not present

## 2018-08-21 LAB — CBC WITH DIFFERENTIAL/PLATELET
Basophils Absolute: 0 10*3/uL (ref 0.0–0.1)
Basophils Relative: 0.3 % (ref 0.0–3.0)
EOS PCT: 3.3 % (ref 0.0–5.0)
Eosinophils Absolute: 0.2 10*3/uL (ref 0.0–0.7)
HCT: 39.8 % (ref 36.0–46.0)
Hemoglobin: 13.4 g/dL (ref 12.0–15.0)
LYMPHS ABS: 2.1 10*3/uL (ref 0.7–4.0)
Lymphocytes Relative: 30.6 % (ref 12.0–46.0)
MCHC: 33.8 g/dL (ref 30.0–36.0)
MCV: 92.2 fl (ref 78.0–100.0)
MONO ABS: 0.6 10*3/uL (ref 0.1–1.0)
MONOS PCT: 9.5 % (ref 3.0–12.0)
NEUTROS ABS: 3.8 10*3/uL (ref 1.4–7.7)
NEUTROS PCT: 56.3 % (ref 43.0–77.0)
Platelets: 324 10*3/uL (ref 150.0–400.0)
RBC: 4.31 Mil/uL (ref 3.87–5.11)
RDW: 12.9 % (ref 11.5–15.5)
WBC: 6.8 10*3/uL (ref 4.0–10.5)

## 2018-08-21 LAB — HEPATIC FUNCTION PANEL
ALBUMIN: 4.3 g/dL (ref 3.5–5.2)
ALT: 9 U/L (ref 0–35)
AST: 13 U/L (ref 0–37)
Alkaline Phosphatase: 44 U/L (ref 39–117)
BILIRUBIN TOTAL: 0.5 mg/dL (ref 0.2–1.2)
Bilirubin, Direct: 0.1 mg/dL (ref 0.0–0.3)
Total Protein: 7.4 g/dL (ref 6.0–8.3)

## 2018-08-21 LAB — LIPID PANEL
Cholesterol: 137 mg/dL (ref 0–200)
HDL: 56.7 mg/dL (ref >39.00)
LDL Cholesterol: 71 mg/dL (ref 0–99)
NONHDL: 80.2
TRIGLYCERIDES: 44 mg/dL (ref 0.0–149.0)
Total CHOL/HDL Ratio: 2
VLDL: 8.8 mg/dL (ref 0.0–40.0)

## 2018-08-21 LAB — TSH: TSH: 1.19 u[IU]/mL (ref 0.35–4.50)

## 2018-08-21 LAB — T4, FREE: FREE T4: 0.73 ng/dL (ref 0.60–1.60)

## 2018-08-24 ENCOUNTER — Encounter: Payer: Self-pay | Admitting: Internal Medicine

## 2018-08-24 ENCOUNTER — Other Ambulatory Visit: Payer: Self-pay | Admitting: Internal Medicine

## 2018-08-24 ENCOUNTER — Other Ambulatory Visit: Payer: No Typology Code available for payment source

## 2018-08-24 DIAGNOSIS — F909 Attention-deficit hyperactivity disorder, unspecified type: Secondary | ICD-10-CM | POA: Insufficient documentation

## 2018-08-24 MED ORDER — LISDEXAMFETAMINE DIMESYLATE 10 MG PO CAPS: 10.0000 mg | ORAL_CAPSULE | Freq: Every day | ORAL | 0 refills | Status: DC

## 2018-08-27 ENCOUNTER — Other Ambulatory Visit: Payer: No Typology Code available for payment source

## 2018-09-11 ENCOUNTER — Encounter: Payer: Self-pay | Admitting: Internal Medicine

## 2018-09-11 ENCOUNTER — Ambulatory Visit (INDEPENDENT_AMBULATORY_CARE_PROVIDER_SITE_OTHER): Payer: No Typology Code available for payment source | Admitting: Internal Medicine

## 2018-09-11 VITALS — BP 104/78 | HR 82 | Temp 98.3°F | Ht 66.0 in | Wt 154.0 lb

## 2018-09-11 DIAGNOSIS — R109 Unspecified abdominal pain: Secondary | ICD-10-CM

## 2018-09-11 DIAGNOSIS — J029 Acute pharyngitis, unspecified: Secondary | ICD-10-CM | POA: Diagnosis not present

## 2018-09-11 DIAGNOSIS — L219 Seborrheic dermatitis, unspecified: Secondary | ICD-10-CM

## 2018-09-11 LAB — POCT RAPID STREP A (OFFICE): Rapid Strep A Screen: NEGATIVE

## 2018-09-11 LAB — POC INFLUENZA A&B (BINAX/QUICKVUE)
Influenza A, POC: NEGATIVE
Influenza B, POC: NEGATIVE

## 2018-09-11 MED ORDER — CLOBETASOL PROPIONATE 0.05 % EX FOAM: Freq: Two times a day (BID) | CUTANEOUS | 11 refills | Status: DC

## 2018-09-11 MED ORDER — KETOCONAZOLE 2 % EX SHAM: 1.0000 "application " | MEDICATED_SHAMPOO | CUTANEOUS | 11 refills | Status: DC

## 2018-09-11 MED FILL — VYVANSE 20 MG CAPSULE: 20 | 30 days supply | Qty: 30 | Fill #0

## 2018-09-11 NOTE — Progress Notes (Signed)
Pre visit review using our clinic review tool, if applicable. No additional management support is needed unless otherwise documented below in the visit note. 

## 2018-09-11 NOTE — Patient Instructions (Addendum)
Please get Xray at [facility]  Let me know about Toradol injection for pain  If Xray is negative will do CT scan  Try Tylenol or Ibuprofen as needed    Cepacol or sucrets  Warm salt water gargles    Sore Throat A sore throat is pain, burning, irritation, or scratchiness in the throat. When you have a sore throat, you may feel pain or tenderness in your throat when you swallow or talk. Many things can cause a sore throat, including:  An infection.  Seasonal allergies.  Dryness in the air.  Irritants, such as smoke or pollution.  Gastroesophageal reflux disease (GERD).  A tumor.  A sore throat is often the first sign of another sickness. It may happen with other symptoms, such as coughing, sneezing, fever, and swollen neck glands. Most sore throats go away without medical treatment. Follow these instructions at home:  Take over-the-counter medicines only as told by your health care provider.  Drink enough fluids to keep your urine clear or pale yellow.  Rest as needed.  To help with pain, try: ? Sipping warm liquids, such as broth, herbal tea, or warm water. ? Eating or drinking cold or frozen liquids, such as frozen ice pops. ? Gargling with a salt-water mixture 3-4 times a day or as needed. To make a salt-water mixture, completely dissolve -1 tsp of salt in 1 cup of warm water. ? Sucking on hard candy or throat lozenges. ? Putting a cool-mist humidifier in your bedroom at night to moisten the air. ? Sitting in the bathroom with the door closed for 5-10 minutes while you run hot water in the shower.  Do not use any tobacco products, such as cigarettes, chewing tobacco, and e-cigarettes. If you need help quitting, ask your health care provider. Contact a health care provider if:  You have a fever for more than 2-3 days.  You have symptoms that last (are persistent) for more than 2-3 days.  Your throat does not get better within 7 days.  You have a fever and your  symptoms suddenly get worse. Get help right away if:  You have difficulty breathing.  You cannot swallow fluids, soft foods, or your saliva.  You have increased swelling in your throat or neck.  You have persistent nausea and vomiting. This information is not intended to replace advice given to you by your health care provider. Make sure you discuss any questions you have with your health care provider. Document Released: 01/23/2005 Document Revised: 08/11/2016 Document Reviewed: 10/06/2015 Elsevier Interactive Patient Education  2018 [employer].    Flank Pain, Adult Flank pain is pain that is located on the side of the body between the upper abdomen and the back. This area is called the flank. The pain may occur over a short period of time (acute), or it may be long-term or recurring (chronic). It may be mild or severe. Flank pain can be caused by many things, including:  Muscle soreness or injury.  Kidney stones or kidney disease.  Stress.  A disease of the spine (vertebral disk disease).  A lung infection (pneumonia).  Fluid around the lungs (pulmonary edema).  A skin rash caused by the chickenpox virus (shingles).  Tumors that affect the back of the abdomen.  Gallbladder disease.  Follow these instructions at home:  Drink enough fluid to keep your urine clear or pale yellow.  Rest as told by your health care provider.  Take over-the-counter and prescription medicines only as told  by your health care provider.  Keep a journal to track what has caused your flank pain and what has made it feel better.  Keep all follow-up visits as told by your health care provider. This is important. Contact a health care provider if:  Your pain is not controlled with medicine.  You have new symptoms.  Your pain gets worse.  You have a fever.  Your symptoms last longer than 2-3 days.  You have trouble urinating or you are urinating very frequently. Get help right away  if:  You have trouble breathing or you are short of breath.  Your abdomen hurts or it is swollen or red.  You have nausea or vomiting.  You feel faint or you pass out.  You have blood in your urine. Summary  Flank pain is pain that is located on the side of the body between the upper abdomen and the back.  The pain may occur over a short period of time (acute), or it may be long-term or recurring (chronic). It may be mild or severe.  Flank pain can be caused by many things.  Contact your health care provider if your symptoms get worse or they last longer than 2-3 days. This information is not intended to replace advice given to you by your health care provider. Make sure you discuss any questions you have with your health care provider. Document Released: 02/06/2006 Document Revised: 02/28/2017 Document Reviewed: 02/28/2017 Elsevier Interactive Patient Education  2018 [employer].

## 2018-09-11 NOTE — Progress Notes (Addendum)
Chief Complaint  Patient presents with  . Flank Pain  . Abdominal Pain   F/ u 1. C/o scale to scalp and wants Rx refill of Ketaconasole 2% shampoo and Clobetasol foam  2. C/o left flank pain 6/10 intermittent Tried Tylenol and Aleve x 2 pills w/o help. This is reason for visit today she does report she is on menses and review of chart with h/o ruptured cyst CT 08/28/11. She also c/o LLQ pressure uncomfortable.  3. H/o abnormal pap and HPV + pap 08/26/18 HPV + though had resolved for some time [facility] wanted to repeat pap in 6 months pt wants to change care to [facility] but will let me know due 01/2019 for f/u  4. C/o sore throat today daughter recently dx'ed flu strep and flu negative. Nothing tried. GERD controlled on Prevacid and otc ranitidine    Review of Systems  Constitutional: Negative for fever.  HENT: Positive for sore throat.   Eyes: Negative for blurred vision.  Respiratory: Negative for shortness of breath.   Cardiovascular: Negative for chest pain.  Gastrointestinal: Negative for abdominal pain.  Genitourinary: Positive for flank pain.  Skin: Negative for rash.       Scaly scalp  Neurological: Negative for headaches.  Psychiatric/Behavioral: Negative for depression.   Past Medical History:  Diagnosis Date  . Abnormal Pap smear of cervix    ASCUS, +HPV  . Anxiety   . Bunion, left foot   . Chicken pox   . Constipation   . Constipation   . Genital herpes   . PTSD (post-traumatic stress disorder)    previous marital issues (ex husband)   Past Surgical History:  Procedure Laterality Date  . APPENDECTOMY  2003  . TUBAL LIGATION N/A 06/28/2016   Procedure: POST PARTUM TUBAL LIGATION;  Surgeon: Boykin Nearing, MD;  Location: [facility] ORS;  Service: Gynecology;  Laterality: N/A;   Family History  Problem Relation Age of Onset  . Heart disease Maternal Grandmother   . Hyperlipidemia Maternal Grandmother   . Stroke Maternal Grandmother   . Hyperlipidemia Mother   .  Obesity Mother   . Alcohol abuse Paternal Grandfather   . Obesity Father   . Obesity Sister   . Obesity Brother   . Lung cancer Maternal Grandfather        smoker  . Colon cancer Neg Hx   . Colitis Neg Hx   . Crohn's disease Neg Hx   . Diabetes Neg Hx   . Kidney disease Neg Hx   . Liver disease Neg Hx    Social History   Socioeconomic History  . Marital status: Married    Spouse name: Not on file  . Number of children: 2  . Years of education: 82  . Highest education level: Not on file  Occupational History  . Occupation: Designer, jewellery    Comment: [place] at [employer]  . Financial resource strain: Not on file  . Food insecurity:    Worry: Not on file    Inability: Not on file  . Transportation needs:    Medical: Not on file    Non-medical: Not on file  Tobacco Use  . Smoking status: Former Smoker    Packs/day: 1.00    Years: 8.00    Pack years: 8.00    Last attempt to quit: 12/30/2004    Years since quitting: 13.7  . Smokeless tobacco: Never Used  . Tobacco comment: in college only  Substance  and Sexual Activity  . Alcohol use: Yes    Alcohol/week: 2.0 standard drinks    Types: 2 Standard drinks or equivalent per week    Comment: 2 or less drinks weekly  . Drug use: No  . Sexual activity: Yes    Partners: Male    Birth control/protection: Surgical  Lifestyle  . Physical activity:    Days per week: Not on file    Minutes per session: Not on file  . Stress: Not on file  Relationships  . Social connections:    Talks on phone: Not on file    Gets together: Not on file    Attends religious service: Not on file    Active member of club or organization: Not on file    Attends meetings of clubs or organizations: Not on file    Relationship status: Not on file  . Intimate partner violence:    Fear of current or ex partner: Not on file    Emotionally abused: Not on file    Physically abused: Not on file    Forced sexual activity: Not  on file  Other Topics Concern  . Not on file  Social History Narrative   Dilara grew up in [place]. 3 kids and 1 step son. Naesha is currently divorced and re-married. She attended [employer] for her Bachelors in Nursing. She then attended [employer] for her Masters in Adult Oncology Nurse Practitioner. She is working at [employer]. She spends all of her spare time with her children husband and dog      Caffeine - 2 cups of coffee daily then water   Exercise - active   No outpatient medications have been marked as taking for the 09/11/18 encounter (Office Visit) with McLean-Scocuzza, Nino Glow, MD.   Allergies  Allergen Reactions  . Sulfa Antibiotics Anaphylaxis  . Erythromycin Other (See Comments)    Unknown rxn, childhood allergy  . Iron Itching   Recent Results (from the past 2160 hour(s))  T4, free     Status: None   Collection Time: 08/21/18  8:21 AM  Result Value Ref Range   Free T4 0.73 0.60 - 1.60 ng/dL    Comment: Specimens from patients who are undergoing biotin therapy and /or ingesting biotin supplements may contain high levels of biotin.  The higher biotin concentration in these specimens interferes with this Free T4 assay.  Specimens that contain high levels  of biotin may cause false high results for this Free T4 assay.  Please interpret results in light of the total clinical presentation of the patient.    TSH     Status: None   Collection Time: 08/21/18  8:21 AM  Result Value Ref Range   TSH 1.19 0.35 - 4.50 uIU/mL  Lipid panel     Status: None   Collection Time: 08/21/18  8:21 AM  Result Value Ref Range   Cholesterol 137 0 - 200 mg/dL    Comment: ATP III Classification       Desirable:  < 200 mg/dL               Borderline High:  200 - 239 mg/dL          High:  > = 240 mg/dL   Triglycerides 44.0 0.0 - 149.0 mg/dL    Comment: Normal:  <150 mg/dLBorderline High:  150 - 199 mg/dL   HDL 56.70 >39.00 mg/dL   VLDL 8.8 0.0 - 40.0 mg/dL   LDL  Cholesterol  71 0 - 99 mg/dL   Total CHOL/HDL Ratio 2     Comment:                Men          Women1/2 Average Risk     3.4          3.3Average Risk          5.0          4.42X Average Risk          9.6          7.13X Average Risk          15.0          11.0                       NonHDL 80.20     Comment: NOTE:  Non-HDL goal should be 30 mg/dL higher than patient's LDL goal (i.e. LDL goal of < 70 mg/dL, would have non-HDL goal of < 100 mg/dL)  CBC with Differential/Platelet     Status: None   Collection Time: 08/21/18  8:21 AM  Result Value Ref Range   WBC 6.8 4.0 - 10.5 K/uL   RBC 4.31 3.87 - 5.11 Mil/uL   Hemoglobin 13.4 12.0 - 15.0 g/dL   HCT 39.8 36.0 - 46.0 %   MCV 92.2 78.0 - 100.0 fl   MCHC 33.8 30.0 - 36.0 g/dL   RDW 12.9 11.5 - 15.5 %   Platelets 324.0 150.0 - 400.0 K/uL   Neutrophils Relative % 56.3 43.0 - 77.0 %   Lymphocytes Relative 30.6 12.0 - 46.0 %   Monocytes Relative 9.5 3.0 - 12.0 %   Eosinophils Relative 3.3 0.0 - 5.0 %   Basophils Relative 0.3 0.0 - 3.0 %   Neutro Abs 3.8 1.4 - 7.7 K/uL   Lymphs Abs 2.1 0.7 - 4.0 K/uL   Monocytes Absolute 0.6 0.1 - 1.0 K/uL   Eosinophils Absolute 0.2 0.0 - 0.7 K/uL   Basophils Absolute 0.0 0.0 - 0.1 K/uL  Hepatic function panel     Status: None   Collection Time: 08/21/18  8:21 AM  Result Value Ref Range   Total Bilirubin 0.5 0.2 - 1.2 mg/dL   Bilirubin, Direct 0.1 0.0 - 0.3 mg/dL   Alkaline Phosphatase 44 39 - 117 U/L   AST 13 0 - 37 U/L   ALT 9 0 - 35 U/L   Total Protein 7.4 6.0 - 8.3 g/dL   Albumin 4.3 3.5 - 5.2 g/dL   Objective  There is no height or weight on file to calculate BMI. Wt Readings from Last 3 Encounters:  07/17/18 153 lb 9.6 oz (69.7 kg)  06/11/18 156 lb (70.8 kg)  01/07/18 150 lb (68 kg)   Temp Readings from Last 3 Encounters:  07/17/18 98.8 F (37.1 C) (Oral)  06/11/18 98.6 F (37 C) (Oral)  12/05/17 98.9 F (37.2 C) (Oral)   BP Readings from Last 3 Encounters:  07/17/18 (!) 112/58  06/11/18 110/70   01/07/18 104/76   Pulse Readings from Last 3 Encounters:  07/17/18 82  06/11/18 78  01/07/18 83    Physical Exam  Constitutional: She is oriented to person, place, and time. Vital signs are normal. She appears well-developed and well-nourished. She is cooperative.  HENT:  Head: Normocephalic and atraumatic.  Mouth/Throat: Mucous membranes are normal. Posterior oropharyngeal erythema present.  Eyes: Pupils are equal, round,  and reactive to light. Conjunctivae are normal.  Cardiovascular: Normal rate, regular rhythm and normal heart sounds.  Pulmonary/Chest: Effort normal and breath sounds normal.  Abdominal: Soft. Normal appearance and bowel sounds are normal. There is CVA tenderness.  Neurological: She is alert and oriented to person, place, and time. Gait normal.  Skin: Skin is warm, dry and intact.  Scale to scalp   Psychiatric: She has a normal mood and affect. Her speech is normal and behavior is normal. Judgment and thought content normal. Cognition and memory are normal.  Nursing note and vitals reviewed.   Assessment   1. L flank pain ddx stone vs ruptured GU cyst vs other r/o UTI. She is on menses currently  2. seb derm  3. HPV + pap 08/26/18  4. Sore throat strep and flu negative today  5. HM Plan   1. UA and culture today  Xray [facility] will get if negative consider CT ab/pelvis r/o ruptured cysts corpus luteal cyst  2. Refilled meds  3. 01/2019 pt wants to seek OB/GYN care in [place] will let me know  4. Supportive care  5.  UTD flu 09/2017 and Tdap  -will get flu shot at work  Disc HPV Hep B immune and had MMR vaccine   Ob/gyn  Saw 02/18/18 [place] ASCUS HPV negative h/o HPV and colposcopy 09/03/16 for lgsil f/u appt 08/24/18 Dr. Ouida Sills  Reviewed labs 08/22/17 CMET, CBC, TSH, lipid nl -repeat 08/24/18 lfts, cbc, lipid, tsh, t4, UA (h/o abnormal tfts in pregnancy) H/o neg Hep C, HIV  Follows with dermatology Dr. Kellie Moor skin bxs negative in the pastdue to see  02/2019  Saw Dr. Nicolasa Ducking 09/22/18 Vyvanse 20 mg qd and situational anxiety    Provider: Dr. Olivia Mackie McLean-Scocuzza-Internal Medicine

## 2018-09-13 ENCOUNTER — Other Ambulatory Visit: Payer: Self-pay | Admitting: Internal Medicine

## 2018-09-13 DIAGNOSIS — R109 Unspecified abdominal pain: Secondary | ICD-10-CM

## 2018-09-13 LAB — URINALYSIS, ROUTINE W REFLEX MICROSCOPIC
BILIRUBIN URINE: NEGATIVE
GLUCOSE, UA: NEGATIVE
HGB URINE DIPSTICK: NEGATIVE
KETONES UR: NEGATIVE
Leukocytes, UA: NEGATIVE
Nitrite: NEGATIVE
PH: 7.5 (ref 5.0–8.0)
Protein, ur: NEGATIVE
Specific Gravity, Urine: 1.012 (ref 1.001–1.03)

## 2018-09-13 LAB — URINE CULTURE
MICRO NUMBER:: 91100367
SPECIMEN QUALITY:: ADEQUATE

## 2018-09-14 ENCOUNTER — Encounter: Payer: Self-pay | Admitting: Internal Medicine

## 2018-09-15 ENCOUNTER — Ambulatory Visit (HOSPITAL_COMMUNITY)
Admission: RE | Admit: 2018-09-15 | Discharge: 2018-09-15 | Disposition: A | Discharge status: Home / Self Care | Payer: No Typology Code available for payment source | Source: Ambulatory Visit | Attending: Internal Medicine | Admitting: Internal Medicine

## 2018-09-15 ENCOUNTER — Telehealth: Payer: Self-pay

## 2018-09-15 DIAGNOSIS — R109 Unspecified abdominal pain: Secondary | ICD-10-CM | POA: Insufficient documentation

## 2018-09-15 NOTE — Telephone Encounter (Signed)
Copied from [place] [id number]. Topic: General - Other >> Sep 15, 2018  4:15 PM Judyann Munson wrote: Reason for CRM: patient is requesting a call in regards to x-ray results from 09-13-18. Please advise

## 2018-09-15 NOTE — Telephone Encounter (Signed)
Patient was informed that results have yet to come back yet.

## 2018-09-16 ENCOUNTER — Encounter: Payer: Self-pay | Admitting: Internal Medicine

## 2018-09-18 ENCOUNTER — Other Ambulatory Visit: Payer: Self-pay | Admitting: Internal Medicine

## 2018-09-18 DIAGNOSIS — R109 Unspecified abdominal pain: Secondary | ICD-10-CM

## 2018-09-18 NOTE — Telephone Encounter (Signed)
lvm for pt to rtc

## 2018-09-21 ENCOUNTER — Ambulatory Visit (HOSPITAL_COMMUNITY)
Admission: RE | Admit: 2018-09-21 | Discharge: 2018-09-21 | Disposition: A | Discharge status: Home / Self Care | Payer: No Typology Code available for payment source | Source: Ambulatory Visit | Attending: Internal Medicine | Admitting: Internal Medicine

## 2018-09-21 ENCOUNTER — Telehealth: Payer: Self-pay | Admitting: Internal Medicine

## 2018-09-21 ENCOUNTER — Other Ambulatory Visit: Payer: Self-pay | Admitting: Internal Medicine

## 2018-09-21 DIAGNOSIS — R109 Unspecified abdominal pain: Secondary | ICD-10-CM | POA: Insufficient documentation

## 2018-09-21 DIAGNOSIS — N83202 Unspecified ovarian cyst, left side: Secondary | ICD-10-CM | POA: Insufficient documentation

## 2018-09-21 DIAGNOSIS — N200 Calculus of kidney: Secondary | ICD-10-CM | POA: Diagnosis present

## 2018-09-21 DIAGNOSIS — Z87442 Personal history of urinary calculi: Secondary | ICD-10-CM | POA: Insufficient documentation

## 2018-09-21 MED ORDER — IOHEXOL 300 MG/ML  SOLN
100.0000 mL | Freq: Once | INTRAMUSCULAR | Status: AC | PRN
  Administered 2018-09-21: 100 mL via INTRAVENOUS

## 2018-09-21 NOTE — Telephone Encounter (Signed)
Copied from [place]. Topic: Quick Communication - See Telephone Encounter >> Sep 21, 2018  9:38 AM Mylinda Latina, NT wrote: CRM for notification. See Telephone encounter for: 09/21/18. Keri calling from Radiology scheduling states the Technician at [facility] states the patient CT order needs to be changed.  If Dr. Aundra Dubin is looking for renal stones, then the order has to be CT abd/ pelvis w/o IV and w/o oral contrast . Patient has an appt today for her Ct at 4:00pm

## 2018-09-21 NOTE — Telephone Encounter (Signed)
CT order change. Patient has appt at 4:00 today

## 2018-09-21 NOTE — Telephone Encounter (Signed)
Order changed.

## 2018-09-23 LAB — HM MAMMOGRAPHY

## 2018-09-26 ENCOUNTER — Encounter: Payer: Self-pay | Admitting: Internal Medicine

## 2018-10-20 MED FILL — VYVANSE 20 MG CAPSULE: 20 | 30 days supply | Qty: 30 | Fill #0

## 2018-11-04 ENCOUNTER — Telehealth: Payer: Self-pay | Admitting: *Deleted

## 2018-11-04 NOTE — Telephone Encounter (Signed)
Copied from [place] [id number]. Topic: General - Other >> Nov 04, 2018 12:57 PM Ivar Drape wrote: Reason for CRM:   Patient would like to know if she can be worked in today for a painful and swollen left ankle

## 2018-11-05 ENCOUNTER — Ambulatory Visit: Payer: No Typology Code available for payment source | Admitting: Family Medicine

## 2018-11-05 ENCOUNTER — Ambulatory Visit (INDEPENDENT_AMBULATORY_CARE_PROVIDER_SITE_OTHER): Payer: No Typology Code available for payment source | Admitting: Family Medicine

## 2018-11-05 ENCOUNTER — Encounter: Payer: Self-pay | Admitting: Family Medicine

## 2018-11-05 ENCOUNTER — Ambulatory Visit (INDEPENDENT_AMBULATORY_CARE_PROVIDER_SITE_OTHER): Payer: No Typology Code available for payment source

## 2018-11-05 VITALS — BP 102/60 | HR 76 | Temp 98.5°F | Ht 66.0 in | Wt 150.0 lb

## 2018-11-05 DIAGNOSIS — M25572 Pain in left ankle and joints of left foot: Secondary | ICD-10-CM

## 2018-11-05 DIAGNOSIS — S99912A Unspecified injury of left ankle, initial encounter: Secondary | ICD-10-CM

## 2018-11-05 NOTE — Progress Notes (Signed)
Subjective:    Patient ID: Suzanne Evans, female    DOB: 05/12/1981, 37 y.o.   MRN: [id number]  HPI  Presents to clinic c/o Left ankle pain for 2 days.  Patient states she was out running, and left foot slipped into a pothole causing ankle to roll.  Patient states ankle swelled up, she put ice on it and wrapped it with Ace bandage.  Patient has been wearing a wrap all day at work, which has been helpful to calm down both swelling and pain.  She has also been taking Aleve 2 times a day.   Patient Active Problem List   Diagnosis Date Noted  . ADHD 08/24/2018  . Anxiety 07/17/2018  . Hoarseness of voice 07/17/2018  . Atypical squamous cells of undetermined significance on cytologic smear of cervix (ASC-US) 12/05/2017  . Bunion, left foot 12/05/2017  . Constipation 12/05/2017  . Abdominal pain 12/05/2017  . Indication for care in labor and delivery, antepartum 06/27/2016  . Postpartum care following vaginal delivery 06/27/2016  . Pelvic pressure in pregnancy 05/02/2016  . Left facial numbness 10/13/2014  . Pain with swallowing 03/23/2014  . Abdominal pain, epigastric 03/23/2014  . Routine general medical examination at a health care facility 03/03/2014  . Screen for STD (sexually transmitted disease) 03/03/2014  . Situational depression 03/03/2014  . Genital herpes 02/06/2014  . Abnormal uterine bleeding 06/06/2013  . Unspecified symptom associated with female genital organs 06/06/2013  . CIN I (cervical intraepithelial neoplasia I) 05/04/2013  . Papanicolaou smear of cervix with low grade squamous intraepithelial lesion (LGSIL) 04/07/2013   Social History   Tobacco Use  . Smoking status: Former Smoker    Packs/day: 1.00    Years: 8.00    Pack years: 8.00    Last attempt to quit: 12/30/2004    Years since quitting: 13.8  . Smokeless tobacco: Never Used  . Tobacco comment: in college only  Substance Use Topics  . Alcohol use: Yes    Alcohol/week: 2.0 standard drinks   Types: 2 Standard drinks or equivalent per week    Comment: 2 or less drinks weekly   Review of Systems  Constitutional: Negative for chills, fatigue and fever.  HENT: Negative for congestion, ear pain, sinus pain and sore throat.   Eyes: Negative.   Respiratory: Negative for cough, shortness of breath and wheezing.   Cardiovascular: Negative for chest pain, palpitations and leg swelling.  Gastrointestinal: Negative for abdominal pain, diarrhea, nausea and vomiting.  Genitourinary: Negative for dysuria, frequency and urgency.  Musculoskeletal: +L ankle pain Skin: Negative for color change, pallor and rash.  Neurological: Negative for syncope, light-headedness and headaches.  Psychiatric/Behavioral: The patient is not nervous/anxious.       Objective:   Physical Exam  Constitutional: She is oriented to person, place, and time. She appears well-nourished. No distress.  HENT:  Head: Normocephalic and atraumatic.  Cardiovascular: Normal rate.  Pulmonary/Chest: Effort normal.  Musculoskeletal:       Left ankle: She exhibits swelling and ecchymosis.       Feet:  Area of swelling and bruising indicated by red circle on diagram.  Patient is able to wiggle all of her toes that she is able to point and flex foot, then foot side to side and will ankle.  Motion of pointing foot, rolling it inward do cause some pulling pain.  Neurological: She is alert and oriented to person, place, and time.  Skin: Skin is warm and dry. No pallor.  Psychiatric:  She has a normal mood and affect. Her behavior is normal.  Nursing note and vitals reviewed.     Vitals:   11/05/18 1623  BP: 102/60  Pulse: 76  Temp: 98.5 F (36.9 C)  SpO2: 100%   Assessment & Plan:   Left ankle pain/left ankle injury - we will get x-ray of left ankle to rule out fracture.  Suspect patient just has a severe sprain.  Advised to continue wrapping ankle with Ace wrap.  Also offered to write a prescription for a walking boot,  but patient states she has 1 of these at home due to a past foot surgery.  Patient will continue take Aleve twice daily as this is been helpful to reduce pain.  Patient advised that whenever able to elevate ankle up on pillows, can use ice pack to help soothe the pain as well.  Patient aware we will contact her with results of x-ray.  If no fracture found, but pain continues to persist we can consider orthopedic referral at that time.

## 2018-11-18 ENCOUNTER — Encounter: Payer: Self-pay | Admitting: Internal Medicine

## 2018-11-18 ENCOUNTER — Ambulatory Visit (INDEPENDENT_AMBULATORY_CARE_PROVIDER_SITE_OTHER): Payer: No Typology Code available for payment source | Admitting: Internal Medicine

## 2018-11-18 VITALS — BP 102/64 | HR 85 | Temp 99.1°F | Ht 66.0 in | Wt 149.8 lb

## 2018-11-18 DIAGNOSIS — S93402D Sprain of unspecified ligament of left ankle, subsequent encounter: Secondary | ICD-10-CM

## 2018-11-18 DIAGNOSIS — J9801 Acute bronchospasm: Secondary | ICD-10-CM | POA: Insufficient documentation

## 2018-11-18 DIAGNOSIS — N83202 Unspecified ovarian cyst, left side: Secondary | ICD-10-CM | POA: Diagnosis not present

## 2018-11-18 DIAGNOSIS — R87619 Unspecified abnormal cytological findings in specimens from cervix uteri: Secondary | ICD-10-CM | POA: Diagnosis not present

## 2018-11-18 HISTORY — DX: Acute bronchospasm: J98.01

## 2018-11-18 MED ORDER — ALBUTEROL SULFATE HFA 108 (90 BASE) MCG/ACT IN AERS: 1.0000 | INHALATION_SPRAY | Freq: Four times a day (QID) | RESPIRATORY_TRACT | 12 refills | Status: DC | PRN

## 2018-11-18 NOTE — Progress Notes (Signed)
Pre visit review using our clinic review tool, if applicable. No additional management support is needed unless otherwise documented below in the visit note. 

## 2018-11-18 NOTE — Progress Notes (Addendum)
Chief Complaint  Patient presents with  . Follow-up   F/u with 35 y.o son  1. C/o bronchospasm/wheezing with exercise uses albuterol inhaler of her kids 30 min before exercise and helps   2. 11/05/18 left ankle sprain with running and ran into a pot hole at night left ankle was swollen with pain now pain is 4/10 and stiffness in the am Take Aleve 2x per day with RICE but compression was rubbing her achilles so stopped today   C/o right foot bunion s/p left foot surgery with Dr. Paulla Dolly she is considering referral to him soon with insurance needs referrals but will let me know   3. H/o abnormal pap and HPV due to see OB/GYN 02/18/19 to f/u but wants 2nd opinion with Dr. Garwin Brothers or someone in her practice   Review of Systems  Constitutional: Negative for weight loss.  HENT: Negative for hearing loss.   Eyes: Negative for blurred vision.  Respiratory: Positive for wheezing. Negative for shortness of breath.   Cardiovascular: Negative for chest pain.  Musculoskeletal: Positive for joint pain.  Skin: Negative for rash.  Psychiatric/Behavioral: Negative for depression.   Past Medical History:  Diagnosis Date  . Abnormal Pap smear of cervix    ASCUS, +HPV  . Anxiety   . Bunion, left foot   . Chicken pox   . Constipation   . Constipation   . Genital herpes   . PTSD (post-traumatic stress disorder)    previous marital issues (ex husband)   Past Surgical History:  Procedure Laterality Date  . APPENDECTOMY  2003  . TUBAL LIGATION N/A 06/28/2016   Procedure: POST PARTUM TUBAL LIGATION;  Surgeon: Boykin Nearing, MD;  Location: [facility];  Service: Gynecology;  Laterality: N/A;   Family History  Problem Relation Age of Onset  . Heart disease Maternal Grandmother   . Hyperlipidemia Maternal Grandmother   . Stroke Maternal Grandmother   . Hyperlipidemia Mother   . Obesity Mother   . Alcohol abuse Paternal Grandfather   . Obesity Father   . Obesity Sister   . Obesity Brother   .  Lung cancer Maternal Grandfather        smoker  . Colon cancer Neg Hx   . Colitis Neg Hx   . Crohn's disease Neg Hx   . Diabetes Neg Hx   . Kidney disease Neg Hx   . Liver disease Neg Hx    Social History   Socioeconomic History  . Marital status: Married    Spouse name: Not on file  . Number of children: 2  . Years of education: 29  . Highest education level: Not on file  Occupational History  . Occupation: Designer, jewellery    Comment: [employer] at [employer]  . Financial resource strain: Not on file  . Food insecurity:    Worry: Not on file    Inability: Not on file  . Transportation needs:    Medical: Not on file    Non-medical: Not on file  Tobacco Use  . Smoking status: Former Smoker    Packs/day: 1.00    Years: 8.00    Pack years: 8.00    Last attempt to quit: 12/30/2004    Years since quitting: 13.8  . Smokeless tobacco: Never Used  . Tobacco comment: in college only  Substance and Sexual Activity  . Alcohol use: Yes    Alcohol/week: 2.0 standard drinks    Types: 2 Standard drinks or equivalent  per week    Comment: 2 or less drinks weekly  . Drug use: No  . Sexual activity: Yes    Partners: Male    Birth control/protection: Surgical  Lifestyle  . Physical activity:    Days per week: Not on file    Minutes per session: Not on file  . Stress: Not on file  Relationships  . Social connections:    Talks on phone: Not on file    Gets together: Not on file    Attends religious service: Not on file    Active member of club or organization: Not on file    Attends meetings of clubs or organizations: Not on file    Relationship status: Not on file  . Intimate partner violence:    Fear of current or ex partner: Not on file    Emotionally abused: Not on file    Physically abused: Not on file    Forced sexual activity: Not on file  Other Topics Concern  . Not on file  Social History Narrative   Pearly grew up in [place]. 3 kids and 1  step son. Edythe is currently divorced and re-married. She attended [employer] for her Bachelors in Nursing. She then attended [employer] for her Masters in Adult Oncology Nurse Practitioner. She is working at [employer]. She spends all of her spare time with her children husband and dog      Caffeine - 2 cups of coffee daily then water   Exercise - active   Current Meds  Medication Sig  . Ascorbic Acid (VITAMIN C PO) Take by mouth. Pt takes liquid  . Cholecalciferol (VITAMIN D3) 3000 units TABS Take by mouth daily.  . clobetasol (OLUX) 0.05 % topical foam Apply topically 2 (two) times daily. Prn  . ELDERBERRY PO Take by mouth. Pt takes liquid  . ketoconazole (NIZORAL) 2 % shampoo Apply 1 application topically 2 (two) times a week. Let  Seat on x 5 + minutes  . lisdexamfetamine (VYVANSE) 20 MG capsule Take 20 mg by mouth daily.  . Magnesium 400 MG CAPS Take by mouth daily.  . Multiple Vitamins-Minerals (ZINC PO) Take by mouth daily.  . Prenatal Vit w/Fe-Methylfol-FA (TL FOLATE PO) Take by mouth.  . vitamin B-12 (CYANOCOBALAMIN) 1000 MCG tablet Take 1,000 mcg by mouth daily.  Marland Kitchen VITAMIN E PO Take by mouth daily.   Allergies  Allergen Reactions  . Sulfa Antibiotics Anaphylaxis  . Erythromycin Other (See Comments)    Unknown rxn, childhood allergy  . Iron Itching   Recent Results (from the past 2160 hour(s))  T4, free     Status: None   Collection Time: 08/21/18  8:21 AM  Result Value Ref Range   Free T4 0.73 0.60 - 1.60 ng/dL    Comment: Specimens from patients who are undergoing biotin therapy and /or ingesting biotin supplements may contain high levels of biotin.  The higher biotin concentration in these specimens interferes with this Free T4 assay.  Specimens that contain high levels  of biotin may cause false high results for this Free T4 assay.  Please interpret results in light of the total clinical presentation of the patient.    TSH     Status: None    Collection Time: 08/21/18  8:21 AM  Result Value Ref Range   TSH 1.19 0.35 - 4.50 uIU/mL  Lipid panel     Status: None   Collection Time: 08/21/18  8:21 AM  Result  Value Ref Range   Cholesterol 137 0 - 200 mg/dL    Comment: ATP III Classification       Desirable:  < 200 mg/dL               Borderline High:  200 - 239 mg/dL          High:  > = 240 mg/dL   Triglycerides 44.0 0.0 - 149.0 mg/dL    Comment: Normal:  <150 mg/dLBorderline High:  150 - 199 mg/dL   HDL 56.70 >39.00 mg/dL   VLDL 8.8 0.0 - 40.0 mg/dL   LDL Cholesterol 71 0 - 99 mg/dL   Total CHOL/HDL Ratio 2     Comment:                Men          Women1/2 Average Risk     3.4          3.3Average Risk          5.0          4.42X Average Risk          9.6          7.13X Average Risk          15.0          11.0                       NonHDL 80.20     Comment: NOTE:  Non-HDL goal should be 30 mg/dL higher than patient's LDL goal (i.e. LDL goal of < 70 mg/dL, would have non-HDL goal of < 100 mg/dL)  CBC with Differential/Platelet     Status: None   Collection Time: 08/21/18  8:21 AM  Result Value Ref Range   WBC 6.8 4.0 - 10.5 K/uL   RBC 4.31 3.87 - 5.11 Mil/uL   Hemoglobin 13.4 12.0 - 15.0 g/dL   HCT 39.8 36.0 - 46.0 %   MCV 92.2 78.0 - 100.0 fl   MCHC 33.8 30.0 - 36.0 g/dL   RDW 12.9 11.5 - 15.5 %   Platelets 324.0 150.0 - 400.0 K/uL   Neutrophils Relative % 56.3 43.0 - 77.0 %   Lymphocytes Relative 30.6 12.0 - 46.0 %   Monocytes Relative 9.5 3.0 - 12.0 %   Eosinophils Relative 3.3 0.0 - 5.0 %   Basophils Relative 0.3 0.0 - 3.0 %   Neutro Abs 3.8 1.4 - 7.7 K/uL   Lymphs Abs 2.1 0.7 - 4.0 K/uL   Monocytes Absolute 0.6 0.1 - 1.0 K/uL   Eosinophils Absolute 0.2 0.0 - 0.7 K/uL   Basophils Absolute 0.0 0.0 - 0.1 K/uL  Hepatic function panel     Status: None   Collection Time: 08/21/18  8:21 AM  Result Value Ref Range   Total Bilirubin 0.5 0.2 - 1.2 mg/dL   Bilirubin, Direct 0.1 0.0 - 0.3 mg/dL   Alkaline Phosphatase 44 39 -  117 U/L   AST 13 0 - 37 U/L   ALT 9 0 - 35 U/L   Total Protein 7.4 6.0 - 8.3 g/dL   Albumin 4.3 3.5 - 5.2 g/dL  POCT rapid strep A     Status: Normal   Collection Time: 09/11/18  4:32 PM  Result Value Ref Range   Rapid Strep A Screen Negative Negative  Urinalysis, Routine w reflex microscopic     Status: None   Collection Time: 09/11/18  4:40 PM  Result Value Ref Range   Color, Urine YELLOW YELLOW   APPearance CLEAR CLEAR   Specific Gravity, Urine 1.012 1.001 - 1.03   pH 7.5 5.0 - 8.0   Glucose, UA NEGATIVE NEGATIVE   Bilirubin Urine NEGATIVE NEGATIVE   Ketones, ur NEGATIVE NEGATIVE   Hgb urine dipstick NEGATIVE NEGATIVE   Protein, ur NEGATIVE NEGATIVE   Nitrite NEGATIVE NEGATIVE   Leukocytes, UA NEGATIVE NEGATIVE  Urine Culture     Status: Abnormal   Collection Time: 09/11/18  4:40 PM  Result Value Ref Range   MICRO NUMBER: [id number]    SPECIMEN QUALITY: ADEQUATE    Sample Source NOT GIVEN    STATUS: FINAL    Result:      Additional organism(s) less than 10,000 CFU/mL isolated. These organisms, commonly found on external and internal genitalia, are considered colonizers. No further testing performed.   ISOLATE 1: Streptococcus agalactiae (A)     Comment: 1,000-10,000 CFU/mL of Group B Streptococcus isolated Beta-hemolytic Streptococci are predictably susceptible to penicillin and other beta-lactams. Susceptibility testing not routinely performed. Erythromycin and clindamycin are not recommended for  treatment of urinary tract infections, but clindamycin may be useful for treatment of rectovaginal colonization or infection. Any amount of group B Streptococcus in urine specimens obtained from pregnant females is a marker of genital tract colonization.  If this patient is pregnant, please refer to ACOG guidelines for appropriate screening and management of pregnant women.   POC Influenza A&B(BINAX/QUICKVUE)     Status: Normal   Collection Time: 09/11/18  4:54 PM  Result Value Ref  Range   Influenza A, POC Negative Negative   Influenza B, POC Negative Negative   Objective  Body mass index is 24.18 kg/m. Wt Readings from Last 3 Encounters:  11/18/18 149 lb 12.8 oz (67.9 kg)  11/05/18 150 lb (68 kg)  09/11/18 154 lb (69.9 kg)   Temp Readings from Last 3 Encounters:  11/18/18 99.1 F (37.3 C) (Oral)  11/05/18 98.5 F (36.9 C) (Oral)  09/11/18 98.3 F (36.8 C) (Oral)   BP Readings from Last 3 Encounters:  11/18/18 102/64  11/05/18 102/60  09/11/18 104/78   Pulse Readings from Last 3 Encounters:  11/18/18 85  11/05/18 76  09/11/18 82    Physical Exam  Constitutional: She is oriented to person, place, and time. Vital signs are normal. She appears well-developed and well-nourished. She is cooperative.  HENT:  Head: Normocephalic and atraumatic.  Mouth/Throat: Oropharynx is clear and moist and mucous membranes are normal.  Right ear wax not impacted    Eyes: Pupils are equal, round, and reactive to light. Conjunctivae are normal.  Cardiovascular: Normal rate, regular rhythm and normal heart sounds.  Pulmonary/Chest: Effort normal and breath sounds normal.  Neurological: She is alert and oriented to person, place, and time. Gait normal.  Skin: Skin is warm, dry and intact.  Psychiatric: She has a normal mood and affect. Her speech is normal and behavior is normal. Judgment and thought content normal. Cognition and memory are normal.  Nursing note and vitals reviewed.   Assessment   1. Bronchospasm exercise on induced  2. Left ankle 11/05/18 Xray with mild spurring lateral jt line with some lateral ankle edema mild Right foot bunion worsening  3. HM Plan   1. Albuterol inhaler to use 30 min before exercise  2. RICE, prn NSAIDs if not better my chart will refer to Dr. Paulla Dolly and let me know when ready referral Dr. Paulla Dolly for right foot  bunion  3.  UTD flu 09/22/18 and Tdap utd Disc HPV with OB/GYN Hep B immune and had MMR vaccine  Ob/gyn Saw  02/18/18 [facility]: - ASCUS HPV negative h/o HPV and colposcopy 09/03/16 for lgsil f/u appt 08/24/18 Dr. Ouida Sills  -pt requests 2nd opinion with Dr. Garwin Brothers due for f/u 02/18/19 and due for pap will ask they disc HPV vaccine with pt and left collapsed ovarian cyst   Mammogram called to get report from [facility]  H/o neg Hep C, HIV   12/17/18 saw psych started zoloft 25 mg qd and will increase to 50 mg increase GAD, rec therapy [facility] in [place] on Vyvanse 20 mg qd ADHD   02/17/19 say Dr. Nicolasa Ducking on vyvanse 20 mg qd, zoloft 50 mg qd   Provider: Dr. Olivia Mackie McLean-[facility]oc[facility]

## 2018-11-18 NOTE — Patient Instructions (Signed)
Ovarian Cyst An ovarian cyst is a fluid-filled sac that forms on an ovary. The ovaries are small organs that produce eggs in women. Various types of cysts can form on the ovaries. Some may cause symptoms and require treatment. Most ovarian cysts go away on their own, are not cancerous (are benign), and do not cause problems. Common types of ovarian cysts include:  Functional (follicle) cysts. ? Occur during the menstrual cycle, and usually go away with the next menstrual cycle if you do not get pregnant. ? Usually cause no symptoms.  Endometriomas. ? Are cysts that form from the tissue that lines the uterus (endometrium). ? Are sometimes called "chocolate cysts" because they become filled with blood that turns brown. ? Can cause pain in the lower abdomen during intercourse and during your period.  Cystadenoma cysts. ? Develop from cells on the outside surface of the ovary. ? Can get very large and cause lower abdomen pain and pain with intercourse. ? Can cause severe pain if they twist or break open (rupture).  Dermoid cysts. ? Are sometimes found in both ovaries. ? May contain different kinds of body tissue, such as skin, teeth, hair, or cartilage. ? Usually do not cause symptoms unless they get very big.  Theca lutein cysts. ? Occur when too much of a certain hormone (human chorionic gonadotropin) is produced and overstimulates the ovaries to produce an egg. ? Are most common after having procedures used to assist with the conception of a baby (in vitro fertilization).  What are the causes? Ovarian cysts may be caused by:  Ovarian hyperstimulation syndrome. This is a condition that can develop from taking fertility medicines. It causes multiple large ovarian cysts to form.  Polycystic ovarian syndrome (PCOS). This is a common hormonal disorder that can cause ovarian cysts, as well as problems with your period or fertility.  What increases the risk? The following factors may make  you more likely to develop ovarian cysts:  Being overweight or obese.  Taking fertility medicines.  Taking certain forms of hormonal birth control.  Smoking.  What are the signs or symptoms? Many ovarian cysts do not cause symptoms. If symptoms are present, they may include:  Pelvic pain or pressure.  Pain in the lower abdomen.  Pain during sex.  Abdominal swelling.  Abnormal menstrual periods.  Increasing pain with menstrual periods.  How is this diagnosed? These cysts are commonly found during a routine pelvic exam. You may have tests to find out more about the cyst, such as:  Ultrasound.  X-ray of the pelvis.  CT scan.  MRI.  Blood tests.  How is this treated? Many ovarian cysts go away on their own without treatment. Your health care provider may want to check your cyst regularly for 2-3 months to see if it changes. If you are in menopause, it is especially important to have your cyst monitored closely because menopausal women have a higher rate of ovarian cancer. When treatment is needed, it may include:  Medicines to help relieve pain.  A procedure to drain the cyst (aspiration).  Surgery to remove the whole cyst.  Hormone treatment or birth control pills. These methods are sometimes used to help dissolve a cyst.  Follow these instructions at home:  Take over-the-counter and prescription medicines only as told by your health care provider.  Do not drive or use heavy machinery while taking prescription pain medicine.  Get regular pelvic exams and Pap tests as often as told by your health care   provider.  Return to your normal activities as told by your health care provider. Ask your health care provider what activities are safe for you.  Do not use any products that contain nicotine or tobacco, such as cigarettes and e-cigarettes. If you need help quitting, ask your health care provider.  Keep all follow-up visits as told by your health care provider.  This is important. Contact a health care provider if:  Your periods are late, irregular, or painful, or they stop.  You have pelvic pain that does not go away.  You have pressure on your bladder or trouble emptying your bladder completely.  You have pain during sex.  You have any of the following in your abdomen: ? A feeling of fullness. ? Pressure. ? Discomfort. ? Pain that does not go away. ? Swelling.  You feel generally ill.  You become constipated.  You lose your appetite.  You develop severe acne.  You start to have more body hair and facial hair.  You are gaining weight or losing weight without changing your exercise and eating habits.  You think you may be pregnant. Get help right away if:  You have abdominal pain that is severe or gets worse.  You cannot eat or drink without vomiting.  You suddenly develop a fever.  Your menstrual period is much heavier than usual. This information is not intended to replace advice given to you by your health care provider. Make sure you discuss any questions you have with your health care provider. Document Released: 12/16/2005 Document Revised: 07/05/2016 Document Reviewed: 05/19/2016 Elsevier Interactive Patient Education  2018 [employer]  

## 2018-12-01 MED FILL — VYVANSE 20 MG CAPSULE: 20 | 30 days supply | Qty: 30 | Fill #0

## 2019-01-05 MED FILL — SERTRALINE HCL 50 MG TABLET: 50 | 90 days supply | Qty: 90 | Fill #0

## 2019-01-05 MED FILL — VYVANSE 20 MG CAPSULE: 20 | 30 days supply | Qty: 30 | Fill #0

## 2019-02-08 MED FILL — VYVANSE 20 MG CAPSULE: 20 | 30 days supply | Qty: 30 | Fill #0

## 2019-02-24 ENCOUNTER — Other Ambulatory Visit: Payer: Self-pay | Admitting: Obstetrics and Gynecology

## 2019-03-12 MED FILL — VYVANSE 20 MG CAPSULE: 20 | 30 days supply | Qty: 30 | Fill #0

## 2019-04-07 MED FILL — VYVANSE 20 MG CAPSULE: 20 | 30 days supply | Qty: 30 | Fill #0

## 2019-04-07 MED FILL — SERTRALINE HCL 50 MG TABLET: 50 | 90 days supply | Qty: 90 | Fill #0

## 2019-05-17 MED FILL — VYVANSE 20 MG CAPSULE: 20 | 30 days supply | Qty: 30 | Fill #0

## 2019-05-19 ENCOUNTER — Other Ambulatory Visit: Payer: Self-pay

## 2019-05-19 ENCOUNTER — Ambulatory Visit (INDEPENDENT_AMBULATORY_CARE_PROVIDER_SITE_OTHER): Payer: No Typology Code available for payment source | Admitting: Internal Medicine

## 2019-05-19 DIAGNOSIS — F419 Anxiety disorder, unspecified: Secondary | ICD-10-CM

## 2019-05-19 DIAGNOSIS — Z1329 Encounter for screening for other suspected endocrine disorder: Secondary | ICD-10-CM

## 2019-05-19 DIAGNOSIS — Z0000 Encounter for general adult medical examination without abnormal findings: Secondary | ICD-10-CM

## 2019-05-19 DIAGNOSIS — B977 Papillomavirus as the cause of diseases classified elsewhere: Secondary | ICD-10-CM

## 2019-05-19 DIAGNOSIS — J9801 Acute bronchospasm: Secondary | ICD-10-CM

## 2019-05-19 DIAGNOSIS — E221 Hyperprolactinemia: Secondary | ICD-10-CM

## 2019-05-19 DIAGNOSIS — E559 Vitamin D deficiency, unspecified: Secondary | ICD-10-CM

## 2019-05-19 DIAGNOSIS — E538 Deficiency of other specified B group vitamins: Secondary | ICD-10-CM

## 2019-05-19 DIAGNOSIS — Z1389 Encounter for screening for other disorder: Secondary | ICD-10-CM

## 2019-05-19 DIAGNOSIS — Z13220 Encounter for screening for lipoid disorders: Secondary | ICD-10-CM

## 2019-05-19 DIAGNOSIS — R87610 Atypical squamous cells of undetermined significance on cytologic smear of cervix (ASC-US): Secondary | ICD-10-CM

## 2019-05-19 MED ORDER — SERTRALINE HCL 50 MG PO TABS: 50.0000 mg | ORAL_TABLET | Freq: Every day | ORAL | Status: DC

## 2019-05-19 NOTE — Progress Notes (Addendum)
Virtual Visit via Video Note  I connected with Suzanne Evans   on 05/19/19 at  3:30 PM EDT by a video enabled telemedicine application and verified that I am speaking with the correct person using two identifiers.  Location patient: home Location provider:work  Persons participating in the virtual visit: patient, provider  I discussed the limitations of evaluation and management by telemedicine and the availability of in person appointments. The patient expressed understanding and agreed to proceed.   HPI: Annual  1. Left ankle pain improved since 10/2018 did not have to see podiatry Dr. Paulla Dolly it is so much better after ACE wrap she is back to exercising and doing routine called PREP  2. H/o abnormal pap f/u [facility] and 01/2019 had ASCUS pap with HPV + and colp last week with ASCUS and HPV and undergoing her 1st LEEP 05/25/2019  3. 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 General: weight stable  HEENT: no hoarse voice  CV: no chest pain Lungs :no sob  Abdomen: no abdomen pain  MSK: left ankle pain better sometimes swelling when on feet at times overall better  Neuro:no h/a Psych: anxiety and concentration improved on meds   Past Medical History:  Diagnosis Date  . Abnormal Pap smear of cervix    ASCUS, +HPV  . Anxiety   . Bunion, left foot   . Chicken pox   . Constipation   . Constipation   . Genital herpes   . PTSD (post-traumatic stress disorder)    previous marital issues (ex husband)    Past Surgical History:  Procedure Laterality Date  . APPENDECTOMY  2003  . TUBAL LIGATION N/A 06/28/2016   Procedure: POST PARTUM TUBAL LIGATION;  Surgeon: Boykin Nearing, MD;  Location: [facility] OR[facility];  Service: Gynecology;  Laterality: N/A;    Family History  Problem Relation Age of Onset  . Heart disease Maternal Grandmother   .  Hyperlipidemia Maternal Grandmother   . Stroke Maternal Grandmother   . Hyperlipidemia Mother   . Obesity Mother   . Alcohol abuse Paternal Grandfather   . Obesity Father   . Obesity Sister   . Obesity Brother   . Lung cancer Maternal Grandfather        smoker  . Colon cancer Neg Hx   . Colitis Neg Hx   . Crohn's disease Neg Hx   . Diabetes Neg Hx   . Kidney disease Neg Hx   . 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  albuterol (PROVENTIL HFA;VENTOLIN HFA) 108 (90 Base) MCG/ACT inhaler, Inhale 1-2 puffs into the lungs every 6 (six) hours as needed for wheezing or shortness of breath., Disp: 1 Inhaler, Rfl: 12 .  Ascorbic Acid (VITAMIN C PO), Take by mouth. Pt takes liquid, Disp: , Rfl:  .  Cholecalciferol (VITAMIN D3) 3000 units TABS, Take by mouth daily., Disp: , Rfl:  .  clobetasol (OLUX) 0.05 % topical foam, Apply topically 2 (two) times daily. Prn, Disp: 100 g, Rfl: 11 .  ELDERBERRY PO, Take by mouth. Pt takes liquid, Disp: , Rfl:  .  ketoconazole (NIZORAL) 2 % shampoo, Apply 1 application topically 2 (two) times a week. Let  Seat on x 5 + minutes, Disp: 120 mL, Rfl: 11 .  lisdexamfetamine (VYVANSE) 20 MG capsule, Take 20 mg by mouth daily., Disp: , Rfl:  .  Magnesium 400 MG CAPS, Take by mouth daily., Disp: , Rfl:  .  Multiple Vitamins-Minerals (ZINC PO), Take by mouth daily., Disp: , Rfl:  .  Prenatal Vit w/Fe-Methylfol-FA (TL FOLATE PO), Take by mouth., Disp: , Rfl:  .  sertraline (ZOLOFT) 50 MG tablet, Take 1 tablet (50 mg total) by mouth daily., Disp: , Rfl:  .  vitamin B-12 (CYANOCOBALAMIN) 1000 MCG tablet, Take 1,000 mcg by mouth daily., Disp: , Rfl:  .  VITAMIN E PO, Take by mouth daily., Disp: , Rfl:   EXAM:  VITALS per patient if applicable:  GENERAL: alert, oriented, appears well and in no acute distress  HEENT: atraumatic, conjunttiva clear, no obvious abnormalities on inspection of  external nose and ears  NECK: normal movements of the head and neck  LUNGS: on inspection no signs of respiratory distress, breathing rate appears normal, no obvious gross SOB, gasping or wheezing  CV: no obvious cyanosis  MS: moves all visible extremities without noticeable abnormality  PSYCH/NEURO: pleasant and cooperative, no obvious depression or anxiety, speech and thought processing grossly intact  ASSESSMENT AND PLAN:  Discussed the following assessment and plan:  Annual physical exam -sch fasting labs 08/23/2019 will CC Dr. Nicolasa Ducking pt agreeable to [place] her upcoming labs to psych  -continue exercise and healthy diet  UTD flu 09/22/18 and Tdaputd Disc HPV with OB/GYN going to [facility] now  Hep B immune and had MMR vaccine  Ob/gyn Saw 02/18/18 [facility]: - ASCUS HPV negative h/o HPV and colposcopy 09/03/16 for lgsil f/u appt 08/24/18 Dr. Ouida Sills  -pt requests 2nd opinion [facility] now seeing 01/2019 had repeat pap ASCUS with hPV and colp with similar results 04/2019 and 05/25/2019 will undergo 1st LEEP -today requested pt have ob/gyn CC records to [place] via fax   Mammogram [facility] 09/26/18 negative repeat age 38 y.o  H/o neg Hep C, HIV  Anxiety - Plan: sertraline (ZOLOFT) 50 MG tablet qd and improved   Bronchospasm with exercise in cold weather prn albuterol doing well for now    Saw Dr. Nicolasa Ducking 05/28/2019 no change in meds   Dr. Nicolasa Ducking 08/20/19 vyvanse 30 mg qd zoloft 50 mg qd   I discussed the assessment and treatment plan with the patient. The patient was provided an opportunity to ask questions and all were answered. The patient agreed with the plan and demonstrated an understanding of the instructions.   The patient was advised to call back or seek an in-person evaluation if the symptoms worsen or if the condition fails to improve as anticipated.  Time spent 20 minutes  Delorise Jackson, MD

## 2019-06-16 MED FILL — VYVANSE 20 MG CAPSULE: 20 | 30 days supply | Qty: 30 | Fill #0

## 2019-06-21 ENCOUNTER — Encounter: Payer: Self-pay | Admitting: Internal Medicine

## 2019-07-02 MED FILL — SERTRALINE HCL 50 MG TABLET: 50 | 90 days supply | Qty: 90 | Fill #0

## 2019-07-26 MED FILL — VYVANSE 20 MG CAPSULE: 20 | 30 days supply | Qty: 30 | Fill #0

## 2019-08-20 ENCOUNTER — Encounter (INDEPENDENT_AMBULATORY_CARE_PROVIDER_SITE_OTHER): Payer: No Typology Code available for payment source | Admitting: Internal Medicine

## 2019-08-20 DIAGNOSIS — N644 Mastodynia: Secondary | ICD-10-CM

## 2019-08-20 DIAGNOSIS — N6452 Nipple discharge: Secondary | ICD-10-CM

## 2019-08-20 MED FILL — VYVANSE 30 MG CAPSULE: 30 | 30 days supply | Qty: 30 | Fill #0

## 2019-08-20 NOTE — Addendum Note (Signed)
Addended by: Orland Mustard on: 08/20/2019 12:40 PM   Modules accepted: Orders

## 2019-08-23 ENCOUNTER — Other Ambulatory Visit (INDEPENDENT_AMBULATORY_CARE_PROVIDER_SITE_OTHER): Payer: No Typology Code available for payment source

## 2019-08-23 ENCOUNTER — Other Ambulatory Visit: Payer: Self-pay

## 2019-08-23 ENCOUNTER — Other Ambulatory Visit: Payer: Self-pay | Admitting: Internal Medicine

## 2019-08-23 DIAGNOSIS — Z1329 Encounter for screening for other suspected endocrine disorder: Secondary | ICD-10-CM | POA: Diagnosis not present

## 2019-08-23 DIAGNOSIS — N644 Mastodynia: Secondary | ICD-10-CM | POA: Diagnosis not present

## 2019-08-23 DIAGNOSIS — Z13220 Encounter for screening for lipoid disorders: Secondary | ICD-10-CM | POA: Diagnosis not present

## 2019-08-23 DIAGNOSIS — E559 Vitamin D deficiency, unspecified: Secondary | ICD-10-CM | POA: Diagnosis not present

## 2019-08-23 DIAGNOSIS — Z1389 Encounter for screening for other disorder: Secondary | ICD-10-CM

## 2019-08-23 DIAGNOSIS — Z0000 Encounter for general adult medical examination without abnormal findings: Secondary | ICD-10-CM

## 2019-08-23 DIAGNOSIS — E221 Hyperprolactinemia: Secondary | ICD-10-CM | POA: Diagnosis not present

## 2019-08-23 DIAGNOSIS — E538 Deficiency of other specified B group vitamins: Secondary | ICD-10-CM | POA: Diagnosis not present

## 2019-08-23 LAB — CBC WITH DIFFERENTIAL/PLATELET
Basophils Absolute: 0 10*3/uL (ref 0.0–0.1)
Basophils Relative: 0.5 % (ref 0.0–3.0)
Eosinophils Absolute: 0.2 10*3/uL (ref 0.0–0.7)
Eosinophils Relative: 3.3 % (ref 0.0–5.0)
HCT: 41.1 % (ref 36.0–46.0)
Hemoglobin: 13.7 g/dL (ref 12.0–15.0)
Lymphocytes Relative: 30.8 % (ref 12.0–46.0)
Lymphs Abs: 1.9 10*3/uL (ref 0.7–4.0)
MCHC: 33.4 g/dL (ref 30.0–36.0)
MCV: 91.8 fl (ref 78.0–100.0)
Monocytes Absolute: 0.6 10*3/uL (ref 0.1–1.0)
Monocytes Relative: 9.1 % (ref 3.0–12.0)
Neutro Abs: 3.5 10*3/uL (ref 1.4–7.7)
Neutrophils Relative %: 56.3 % (ref 43.0–77.0)
Platelets: 348 10*3/uL (ref 150.0–400.0)
RBC: 4.48 Mil/uL (ref 3.87–5.11)
RDW: 12.8 % (ref 11.5–15.5)
WBC: 6.2 10*3/uL (ref 4.0–10.5)

## 2019-08-23 LAB — LIPID PANEL
Cholesterol: 158 mg/dL (ref 0–200)
HDL: 60.5 mg/dL (ref >39.00)
LDL Cholesterol: 86 mg/dL (ref 0–99)
NonHDL: 97.9
Total CHOL/HDL Ratio: 3
Triglycerides: 61 mg/dL (ref 0.0–149.0)
VLDL: 12.2 mg/dL (ref 0.0–40.0)

## 2019-08-23 LAB — COMPREHENSIVE METABOLIC PANEL
ALT: 9 U/L (ref 0–35)
AST: 15 U/L (ref 0–37)
Albumin: 4.6 g/dL (ref 3.5–5.2)
Alkaline Phosphatase: 56 U/L (ref 39–117)
BUN: 13 mg/dL (ref 6–23)
CO2: 29 mEq/L (ref 19–32)
Calcium: 9.2 mg/dL (ref 8.4–10.5)
Chloride: 101 mEq/L (ref 96–112)
Creatinine, Ser: 0.78 mg/dL (ref 0.40–1.20)
GFR: 83.57 mL/min (ref >60.00)
Glucose, Bld: 100 mg/dL — ABNORMAL HIGH (ref 70–99)
Potassium: 4 mEq/L (ref 3.5–5.1)
Sodium: 138 mEq/L (ref 135–145)
Total Bilirubin: 0.4 mg/dL (ref 0.2–1.2)
Total Protein: 7 g/dL (ref 6.0–8.3)

## 2019-08-23 LAB — POCT URINE PREGNANCY: Preg Test, Ur: NEGATIVE

## 2019-08-23 NOTE — Telephone Encounter (Signed)
That's fine. I am agreeable to mammogram and ultrasound. I go to [facility].     ----- Message -----  From: Delorise Jackson, MD  Sent: 08/20/19, 12:39 PM  To: Scot Dock  Subject: RE: Non-Urgent Medical Question    If this is a new issues and we are addressing it via my chart we are charging a fee are you agreeable?   The work up would be diagnostic mammogram with bilateral ultrasound   -do you want me to order this?     We cam add on prolactin let the lab know as well     [place]      ----- Message -----    From:Suzanne Evans    Sent:08/20/2019 8:53 AM EDT     NN:[id number] N McLean-Scocuzza, MD  Subject:Non-Urgent Medical Question    Hi Dr Linus Orn,    Over the past few weeks I have noted increased breast soreness bilaterally, though worse on the left. I then started noting that I am mildly lactating on the left more so than the right, but still, bilaterally. I obviously know I need to come in for an appointment and I am happy to schedule one for next week, however, I was hoping that I could have a prolactin level ordered with my labs that are going to be drawn on this Monday.     Thanks,    [name]   A/P  Urine pregnancy negative today 08/23/19  Stat mammo diagnostic b/l with b/l [place] [facility]  TMS-pt agreeable to fee  Time speng 5-10 my chart minutes

## 2019-08-23 NOTE — Addendum Note (Signed)
Addended by: Elpidio Galea T on: 08/23/2019 08:24 AM   Modules accepted: Orders

## 2019-08-24 ENCOUNTER — Encounter: Payer: Self-pay | Admitting: Internal Medicine

## 2019-08-24 ENCOUNTER — Telehealth: Payer: Self-pay

## 2019-08-24 LAB — T4, FREE: Free T4: 0.73 ng/dL (ref 0.60–1.60)

## 2019-08-24 LAB — URINALYSIS, ROUTINE W REFLEX MICROSCOPIC
Bilirubin Urine: NEGATIVE
Glucose, UA: NEGATIVE
Hgb urine dipstick: NEGATIVE
Ketones, ur: NEGATIVE
Leukocytes,Ua: NEGATIVE
Nitrite: NEGATIVE
Protein, ur: NEGATIVE
Specific Gravity, Urine: 1.011 (ref 1.001–1.03)
pH: >=8.5 — AB (ref 5.0–8.0)

## 2019-08-24 LAB — PROLACTIN: Prolactin: 36.9 ng/mL — ABNORMAL HIGH

## 2019-08-24 LAB — VITAMIN D 25 HYDROXY (VIT D DEFICIENCY, FRACTURES): VITD: 39 ng/mL (ref 30.00–100.00)

## 2019-08-24 LAB — TSH: TSH: 1.45 u[IU]/mL (ref 0.35–4.50)

## 2019-08-24 LAB — VITAMIN B12: Vitamin B-12: 332 pg/mL (ref 211–911)

## 2019-08-24 NOTE — Telephone Encounter (Signed)
LM for patient to call back to get her scheduled for in person visit as long as patient passes screening.

## 2019-08-26 ENCOUNTER — Other Ambulatory Visit: Payer: Self-pay

## 2019-08-26 ENCOUNTER — Ambulatory Visit (INDEPENDENT_AMBULATORY_CARE_PROVIDER_SITE_OTHER): Payer: No Typology Code available for payment source | Admitting: Internal Medicine

## 2019-08-26 VITALS — BP 104/66 | HR 89 | Temp 98.0°F | Resp 16 | Wt 157.4 lb

## 2019-08-26 DIAGNOSIS — E221 Hyperprolactinemia: Secondary | ICD-10-CM | POA: Diagnosis not present

## 2019-08-26 DIAGNOSIS — N643 Galactorrhea not associated with childbirth: Secondary | ICD-10-CM

## 2019-08-26 DIAGNOSIS — R739 Hyperglycemia, unspecified: Secondary | ICD-10-CM

## 2019-08-26 LAB — POCT GLYCOSYLATED HEMOGLOBIN (HGB A1C): Hemoglobin A1C: 4.9 % (ref 4.0–5.6)

## 2019-08-26 NOTE — Patient Instructions (Signed)
Prolactin Level Test Why am I having this test? The prolactin level test is often used to diagnose and monitor problems with the pituitary gland, such as pituitary tumors. It may also be used to help find the cause of certain other conditions, such as an abnormal absence of menstrual cycles (amenorrhea) or a thyroid gland that does not produce enough hormones (hypothyroidism). Your health care provider may order this test if you have:  Irregular menstrual periods.  Loss of libido.  Milky fluid coming from your nipples (when not breastfeeding).  Fatigue. What is being tested? This test measures the amount of prolactin in your blood. Prolactin is a hormone that is produced by the pituitary gland. Prolactin levels normally go up and down (fluctuate) due to stress, illness, trauma, or surgery. Increased levels can also be caused by tumors or other health problems. What kind of sample is taken?  A blood sample is required for this test. It is usually collected by inserting a needle into a blood vessel. Tell a health care provider about:  All medicines you are taking, including vitamins, herbs, eye drops, creams, and over-the-counter medicines. How are the results reported? Your test results will be reported as values that indicate the amount of prolactin in your blood. Your health care provider will compare your results to normal ranges that were established after testing a large group of people (reference ranges). Reference ranges may vary among labs and hospitals. For this test, common reference ranges are:  Adult female: 3-13 ng/mL.  Adult female: 3-27 ng/mL.  Pregnant female: 20-400 ng/mL. What do the results mean? Increased levels of prolactin may mean that you have:  A pituitary gland tumor.  Amenorrhea.  Hypothyroidism.  Certain pituitary or reproductive syndromes.  Kidney failure. Decreased levels of prolactin may indicate:  Lack of blood to the pituitary gland.   Pituitary gland failure. Talk with your health care provider about what your results mean. Questions to ask your health care provider Ask your health care provider, or the department that is doing the test:  When will my results be ready?  How will I get my results?  What are my treatment options?  What other tests do I need?  What are my next steps? Summary  The prolactin level test is often used to diagnose and monitor problems with the pituitary gland, such as pituitary tumors. It may also be used to help find the cause of certain other conditions, such as amenorrhea or hypothyroidism.  This test measures the amount of prolactin in your blood. Prolactin is a hormone that is produced by the pituitary gland.  Prolactin levels normally go up and down (fluctuate) due to stress, illness, trauma, or surgery. Increased levels can also be caused by tumors or other health problems.  Talk with your health care provider about what your results mean. This information is not intended to replace advice given to you by your health care provider. Make sure you discuss any questions you have with your health care provider. Document Released: 01/18/2005 Document Revised: 08/11/2017 Document Reviewed: 08/11/2017 Elsevier Patient Education  2020 [employer].

## 2019-08-26 NOTE — Progress Notes (Addendum)
Chief Complaint  Patient presents with  . Follow-up   F/u  1. C/o breast soreness L>R w/in the last month and milkly nonbloody discharge from nipples reviewed meds SSRI can casue elevated prolactin but she has been on this and not new and Vyvanse does not, pregnancy test was negative and thyroid labs normal. No h/o elevated prolactin in the past  Results for DEMIRA, GWYNNE (MRN [id number]) as of 08/26/2019 08:45  08/23/2019 08:02 TSH: 1.45 T4,Free(Direct): 0.73 Results for MYALEE, STENGEL (MRN [id number]) as of 08/26/2019 08:45  08/23/2019 08:02 Prolactin: 36.9 (H) Results for LAUREEN, FREDERIC (MRN [id number]) as of 08/26/2019 08:45  08/23/2019 08:24 Preg Test, Ur: Negative  2. Hyperglycemia with FH prediabetes A1C 4.9 today    Review of Systems  Constitutional: Negative for weight loss.  HENT: Negative for hearing loss.   Eyes: Negative for blurred vision.  Respiratory: Negative for shortness of breath.   Cardiovascular: Negative for chest pain.  Gastrointestinal: Negative for abdominal pain.  Genitourinary:       Discharge from b/l nipples milky nonbloody    Skin: Negative for rash.  Neurological: Negative for headaches.       Peripheral vision ok    Psychiatric/Behavioral: Negative for depression.   Past Medical History:  Diagnosis Date  . Abnormal Pap smear of cervix    ASCUS, +HPV  . Anxiety   . Bunion, left foot   . Chicken pox   . Constipation   . Constipation   . Genital herpes   . PTSD (post-traumatic stress disorder)    previous marital issues (ex husband)   Past Surgical History:  Procedure Laterality Date  . APPENDECTOMY  2003  . TUBAL LIGATION N/A 06/28/2016   Procedure: POST PARTUM TUBAL LIGATION;  Surgeon: Boykin Nearing, MD;  Location: [facility] ORS;  Service: Gynecology;  Laterality: N/A;   Family History  Problem Relation Age of Onset  . Heart disease Maternal Grandmother   . Hyperlipidemia Maternal Grandmother   . Stroke Maternal  Grandmother   . Hyperlipidemia Mother   . Obesity Mother   . Alcohol abuse Paternal Grandfather   . Obesity Father   . Obesity Sister   . Obesity Brother   . Lung cancer Maternal Grandfather        smoker  . Colon cancer Neg Hx   . Colitis Neg Hx   . Crohn's disease Neg Hx   . Diabetes Neg Hx   . Kidney disease Neg Hx   . Liver disease Neg Hx    Social History   Socioeconomic History  . Marital status: Married    Spouse name: Not on file  . Number of children: 2  . Years of education: 72  . Highest education level: Not on file  Occupational History  . Occupation: Designer, jewellery    Comment: [employer] at [employer]  . Financial resource strain: Not on file  . Food insecurity    Worry: Not on file    Inability: Not on file  . Transportation needs    Medical: Not on file    Non-medical: Not on file  Tobacco Use  . Smoking status: Former Smoker    Packs/day: 1.00    Years: 8.00    Pack years: 8.00    Quit date: 12/30/2004    Years since quitting: 14.6  . Smokeless tobacco: Never Used  . Tobacco comment: in college only  Substance and Sexual Activity  . Alcohol use: Yes  Alcohol/week: 2.0 standard drinks    Types: 2 Standard drinks or equivalent per week    Comment: 2 or less drinks weekly  . Drug use: No  . Sexual activity: Yes    Partners: Male    Birth control/protection: Surgical  Lifestyle  . Physical activity    Days per week: Not on file    Minutes per session: Not on file  . Stress: Not on file  Relationships  . Social Herbalist on phone: Not on file    Gets together: Not on file    Attends religious service: Not on file    Active member of club or organization: Not on file    Attends meetings of clubs or organizations: Not on file    Relationship status: Not on file  . Intimate partner violence    Fear of current or ex partner: Not on file    Emotionally abused: Not on file    Physically abused: Not on file     Forced sexual activity: Not on file  Other Topics Concern  . Not on file  Social History Narrative   Vinaya grew up in [place]. 3 kids and 1 step son. Veida is currently divorced and re-married. She attended [employer] for her Bachelors in Nursing. She then attended [employer] for her Masters in Adult Oncology Nurse Practitioner. She is working at [employer]. She spends all of her spare time with her children husband and dog      Caffeine - 2 cups of coffee daily then water   Exercise - active   No outpatient medications have been marked as taking for the 08/26/19 encounter (Office Visit) with McLean-Scocuzza, Nino Glow, MD.   Allergies  Allergen Reactions  . Sulfa Antibiotics Anaphylaxis  . Erythromycin Other (See Comments)    Unknown rxn, childhood allergy  . Iron Itching   Recent Results (from the past 2160 hour(s))  Prolactin     Status: Abnormal   Collection Time: 08/23/19  8:02 AM  Result Value Ref Range   Prolactin 36.9 (H) ng/mL    Comment:             Reference Range  Females         Non-pregnant        3.0-30.0         Pregnant           10.0-209.0         Postmenopausal      2.0-20.0 . . .   B12     Status: None   Collection Time: 08/23/19  8:02 AM  Result Value Ref Range   Vitamin B-12 332 211 - 911 pg/mL  Vitamin D (25 hydroxy)     Status: None   Collection Time: 08/23/19  8:02 AM  Result Value Ref Range   VITD 39.00 30.00 - 100.00 ng/mL  Urinalysis, Routine w reflex microscopic     Status: Abnormal   Collection Time: 08/23/19  8:02 AM  Result Value Ref Range   Color, Urine YELLOW YELLOW   APPearance CLOUDY (A) CLEAR   Specific Gravity, Urine 1.011 1.001 - 1.03   pH > OR = 8.5 (A) 5.0 - 8.0   Glucose, UA NEGATIVE NEGATIVE   Bilirubin Urine NEGATIVE NEGATIVE   Ketones, ur NEGATIVE NEGATIVE   Hgb urine dipstick NEGATIVE NEGATIVE   Protein, ur NEGATIVE NEGATIVE   Nitrite NEGATIVE NEGATIVE   Leukocytes,Ua NEGATIVE NEGATIVE  T4, free  Status: None   Collection Time: 08/23/19  8:02 AM  Result Value Ref Range   Free T4 0.73 0.60 - 1.60 ng/dL    Comment: Specimens from patients who are undergoing biotin therapy and /or ingesting biotin supplements may contain high levels of biotin.  The higher biotin concentration in these specimens interferes with this Free T4 assay.  Specimens that contain high levels  of biotin may cause false high results for this Free T4 assay.  Please interpret results in light of the total clinical presentation of the patient.    TSH     Status: None   Collection Time: 08/23/19  8:02 AM  Result Value Ref Range   TSH 1.45 0.35 - 4.50 uIU/mL  Lipid panel     Status: None   Collection Time: 08/23/19  8:02 AM  Result Value Ref Range   Cholesterol 158 0 - 200 mg/dL    Comment: ATP III Classification       Desirable:  < 200 mg/dL               Borderline High:  200 - 239 mg/dL          High:  > = 240 mg/dL   Triglycerides 61.0 0.0 - 149.0 mg/dL    Comment: Normal:  <150 mg/dLBorderline High:  150 - 199 mg/dL   HDL 60.50 >39.00 mg/dL   VLDL 12.2 0.0 - 40.0 mg/dL   LDL Cholesterol 86 0 - 99 mg/dL   Total CHOL/HDL Ratio 3     Comment:                Men          Women1/2 Average Risk     3.4          3.3Average Risk          5.0          4.42X Average Risk          9.6          7.13X Average Risk          15.0          11.0                       NonHDL 97.90     Comment: NOTE:  Non-HDL goal should be 30 mg/dL higher than patient's LDL goal (i.e. LDL goal of < 70 mg/dL, would have non-HDL goal of < 100 mg/dL)  CBC w/Diff     Status: None   Collection Time: 08/23/19  8:02 AM  Result Value Ref Range   WBC 6.2 4.0 - 10.5 K/uL   RBC 4.48 3.87 - 5.11 Mil/uL   Hemoglobin 13.7 12.0 - 15.0 g/dL   HCT 41.1 36.0 - 46.0 %   MCV 91.8 78.0 - 100.0 fl   MCHC 33.4 30.0 - 36.0 g/dL   RDW 12.8 11.5 - 15.5 %   Platelets 348.0 150.0 - 400.0 K/uL   Neutrophils Relative % 56.3 43.0 - 77.0 %   Lymphocytes Relative 30.8  12.0 - 46.0 %   Monocytes Relative 9.1 3.0 - 12.0 %   Eosinophils Relative 3.3 0.0 - 5.0 %   Basophils Relative 0.5 0.0 - 3.0 %   Neutro Abs 3.5 1.4 - 7.7 K/uL   Lymphs Abs 1.9 0.7 - 4.0 K/uL   Monocytes Absolute 0.6 0.1 - 1.0 K/uL   Eosinophils Absolute 0.2 0.0 - 0.7 K/uL  Basophils Absolute 0.0 0.0 - 0.1 K/uL  Comprehensive metabolic panel     Status: Abnormal   Collection Time: 08/23/19  8:02 AM  Result Value Ref Range   Sodium 138 135 - 145 mEq/L   Potassium 4.0 3.5 - 5.1 mEq/L   Chloride 101 96 - 112 mEq/L   CO2 29 19 - 32 mEq/L   Glucose, Bld 100 (H) 70 - 99 mg/dL   BUN 13 6 - 23 mg/dL   Creatinine, Ser 0.78 0.40 - 1.20 mg/dL   Total Bilirubin 0.4 0.2 - 1.2 mg/dL   Alkaline Phosphatase 56 39 - 117 U/L   AST 15 0 - 37 U/L   ALT 9 0 - 35 U/L   Total Protein 7.0 6.0 - 8.3 g/dL   Albumin 4.6 3.5 - 5.2 g/dL   Calcium 9.2 8.4 - 10.5 mg/dL   GFR 83.57 >60.00 mL/min  POCT urine pregnancy     Status: None   Collection Time: 08/23/19  8:24 AM  Result Value Ref Range   Preg Test, Ur Negative Negative  POCT glycosylated hemoglobin (Hb A1C)     Status: None   Collection Time: 08/26/19  9:10 AM  Result Value Ref Range   Hemoglobin A1C 4.9 4.0 - 5.6 %   HbA1c POC (<> result, manual entry)     HbA1c, POC (prediabetic range)     HbA1c, POC (controlled diabetic range)     Objective  Body mass index is 25.41 kg/m. Wt Readings from Last 3 Encounters:  08/26/19 157 lb 6.4 oz (71.4 kg)  11/18/18 149 lb 12.8 oz (67.9 kg)  11/05/18 150 lb (68 kg)   Temp Readings from Last 3 Encounters:  08/26/19 98 F (36.7 C)  11/18/18 99.1 F (37.3 C) (Oral)  11/05/18 98.5 F (36.9 C) (Oral)   BP Readings from Last 3 Encounters:  08/26/19 104/66  11/18/18 102/64  11/05/18 102/60   Pulse Readings from Last 3 Encounters:  08/26/19 89  11/18/18 85  11/05/18 76    Physical Exam Vitals signs and nursing note reviewed.  Constitutional:      Appearance: Normal appearance. She is  well-developed and well-groomed.  HENT:     Head: Normocephalic and atraumatic.     Comments: +mask on   Cardiovascular:     Rate and Rhythm: Normal rate and regular rhythm.     Heart sounds: Normal heart sounds. No murmur.  Pulmonary:     Effort: Pulmonary effort is normal.     Breath sounds: Normal breath sounds.  Chest:     Breasts:        Right: No swelling, bleeding, inverted nipple, mass, nipple discharge, skin change or tenderness.        Left: No swelling, bleeding, inverted nipple, mass, nipple discharge, skin change or tenderness.     Comments: L breast larger than right breast no discharge today or soreness  Lymphadenopathy:     Upper Body:     Right upper body: No axillary adenopathy.     Left upper body: No axillary adenopathy.  Skin:    General: Skin is warm and dry.  Neurological:     General: No focal deficit present.     Mental Status: She is alert. Mental status is at baseline.     Gait: Gait normal.  Psychiatric:        Attention and Perception: Attention and perception normal.        Mood and Affect: Mood and affect normal.  Speech: Speech normal.        Behavior: Behavior normal. Behavior is cooperative.        Thought Content: Thought content normal.        Cognition and Memory: Cognition and memory normal.        Judgment: Judgment normal.   Results for LAVAYA, DEFREITAS (MRN [id number]) as of 08/26/2019 08:45  Ref. Range 08/23/2019 08:02  Sodium Latest Ref Range: 135 - 145 mEq/L 138  Potassium Latest Ref Range: 3.5 - 5.1 mEq/L 4.0  Chloride Latest Ref Range: 96 - 112 mEq/L 101  CO2 Latest Ref Range: 19 - 32 mEq/L 29  Glucose Latest Ref Range: 70 - 99 mg/dL 100 (H)  BUN Latest Ref Range: 6 - 23 mg/dL 13  Creatinine Latest Ref Range: 0.40 - 1.20 mg/dL 0.78  Calcium Latest Ref Range: 8.4 - 10.5 mg/dL 9.2  Alkaline Phosphatase Latest Ref Range: 39 - 117 U/L 56  Albumin Latest Ref Range: 3.5 - 5.2 g/dL 4.6  AST Latest Ref Range: 0 - 37 U/L 15  ALT  Latest Ref Range: 0 - 35 U/L 9  Total Protein Latest Ref Range: 6.0 - 8.3 g/dL 7.0  Total Bilirubin Latest Ref Range: 0.2 - 1.2 mg/dL 0.4  GFR Latest Ref Range: >60.00 mL/min 83.57  Total CHOL/HDL Ratio Unknown 3  Cholesterol Latest Ref Range: 0 - 200 mg/dL 158  HDL Cholesterol Latest Ref Range: >39.00 mg/dL 60.50  LDL (calc) Latest Ref Range: 0 - 99 mg/dL 86  NonHDL Unknown 97.90  Triglycerides Latest Ref Range: 0.0 - 149.0 mg/dL 61.0  VLDL Latest Ref Range: 0.0 - 40.0 mg/dL 12.2  VITD Latest Ref Range: 30.00 - 100.00 ng/mL 39.00  Vitamin B12 Latest Ref Range: 211 - 911 pg/mL 332  WBC Latest Ref Range: 4.0 - 10.5 K/uL 6.2  RBC Latest Ref Range: 3.87 - 5.11 Mil/uL 4.48  Hemoglobin Latest Ref Range: 12.0 - 15.0 g/dL 13.7  HCT Latest Ref Range: 36.0 - 46.0 % 41.1  MCV Latest Ref Range: 78.0 - 100.0 fl 91.8  MCHC Latest Ref Range: 30.0 - 36.0 g/dL 33.4  RDW Latest Ref Range: 11.5 - 15.5 % 12.8  Platelets Latest Ref Range: 150.0 - 400.0 K/uL 348.0  Neutrophils Latest Ref Range: 43.0 - 77.0 % 56.3  Lymphocytes Latest Ref Range: 12.0 - 46.0 % 30.8  Monocytes Relative Latest Ref Range: 3.0 - 12.0 % 9.1  Eosinophil Latest Ref Range: 0.0 - 5.0 % 3.3  Basophil Latest Ref Range: 0.0 - 3.0 % 0.5  NEUT# Latest Ref Range: 1.4 - 7.7 K/uL 3.5  Lymphocyte # Latest Ref Range: 0.7 - 4.0 K/uL 1.9  Monocyte # Latest Ref Range: 0.1 - 1.0 K/uL 0.6  Eosinophils Absolute Latest Ref Range: 0.0 - 0.7 K/uL 0.2  Basophils Absolute Latest Ref Range: 0.0 - 0.1 K/uL 0.0  Prolactin Latest Units: ng/mL 36.9 (H)  TSH Latest Ref Range: 0.35 - 4.50 uIU/mL 1.45  T4,Free(Direct) Latest Ref Range: 0.60 - 1.60 ng/dL 0.73   Results for ANGELIC, SCHNELLE (MRN [id number]) as of 08/26/2019 08:45  Ref. Range 08/23/2019 08:02  Appearance Latest Ref Range: CLEAR  CLOUDY (A)  Bilirubin Urine Latest Ref Range: NEGATIVE  NEGATIVE  Color, Urine Latest Ref Range: YELLOW  YELLOW  Glucose, UA Latest Ref Range: NEGATIVE  NEGATIVE   Hgb urine dipstick Latest Ref Range: NEGATIVE  NEGATIVE  Ketones, ur Latest Ref Range: NEGATIVE  NEGATIVE  Leukocytes,Ua Latest Ref Range: NEGATIVE  NEGATIVE  Nitrite  Latest Ref Range: NEGATIVE  NEGATIVE  pH Latest Ref Range: 5.0 - 8.0  > OR = 8.5 (A)  Protein Latest Ref Range: NEGATIVE  NEGATIVE  Specific Gravity, Urine Latest Ref Range: 1.001 - 1.03  1.011   A/P 1. Elevated prolactin with galactorrhea could be 2/2 SSRI zoloft though she has been on this for a while  -will refer Dr. Buddy Duty endocrine  -MRI brain pit. Protocol to w/u at [facility]   2. HM  -continue exercise and healthy diet  UTD flu9/24/19 will get at work  [facility] going to [facility] now  Hep B immune and had MMR vaccine  Ob/gyn Saw 02/18/18 [facility]: -ASCUS HPV negative h/o HPV and colposcopy 09/03/16 for lgsil f/u appt 08/24/18 Dr. Ouida Sills  -pt requests 2nd opinion [facility] now seeing 01/2019 had repeat pap ASCUS with hPV and colp with similar results 04/2019 and 05/25/2019 will undergo 1st LEEP -prev requested pt have ob/gyn [facility] records to [place] via fax  A1C 4.9 today 08/26/19   Mammogram [facility]9/28/19 negative repeat age 57 y.o  -hold on repeat mammogram for now given sx's   H/o neg Hep C, HIV  Of note Saw Dr. Nicolasa Ducking 05/28/2019 no change in meds   Dr. Nicolasa Ducking 08/20/19 vyvanse 30 mg qd zoloft 50 mg qd   Saw psych 09/17/19 no change in meds vyvanse 30 mg qd   12/10/19 psych zoloft 100 mg, vyvanse 30 mg qd (GAD, ADHD)   12/30/19 psych appt adhd, anxiety on vyvanse 30 and zoloft 100 mg qd   Provider: Dr. Olivia Mackie McLean-Scocuzza-Internal Medicine

## 2019-08-30 NOTE — Progress Notes (Signed)
Re: Thank you  Melissa please see referral   Thanks  Carmine,   I will be glad to help Suzanne Evans evaluate and address her hyperprolactinemia. Please send a referral so my staff may help schedule her visit.   Thank You,  Merry Proud

## 2019-09-03 ENCOUNTER — Other Ambulatory Visit: Payer: Self-pay

## 2019-09-03 ENCOUNTER — Encounter: Payer: Self-pay | Admitting: Internal Medicine

## 2019-09-03 ENCOUNTER — Ambulatory Visit (HOSPITAL_COMMUNITY)
Admission: RE | Admit: 2019-09-03 | Discharge: 2019-09-03 | Disposition: A | Discharge status: Home / Self Care | Payer: No Typology Code available for payment source | Source: Ambulatory Visit | Attending: Internal Medicine | Admitting: Internal Medicine

## 2019-09-03 DIAGNOSIS — E221 Hyperprolactinemia: Secondary | ICD-10-CM | POA: Diagnosis not present

## 2019-09-03 DIAGNOSIS — D369 Benign neoplasm, unspecified site: Secondary | ICD-10-CM | POA: Insufficient documentation

## 2019-09-03 DIAGNOSIS — N643 Galactorrhea not associated with childbirth: Secondary | ICD-10-CM | POA: Diagnosis present

## 2019-09-03 MED ORDER — GADOBUTROL 1 MMOL/ML IV SOLN
7.0000 mL | Freq: Once | INTRAVENOUS | Status: AC | PRN
  Administered 2019-09-03: 7 mL via INTRAVENOUS

## 2019-09-24 MED FILL — SERTRALINE HCL 50 MG TABLET: 50 | 90 days supply | Qty: 90 | Fill #1

## 2019-10-01 MED FILL — VYVANSE 30 MG CAPSULE: 30 | 30 days supply | Qty: 30 | Fill #0

## 2019-10-06 MED FILL — CABERGOLINE 0.5 MG TABS: 0.5 | 84 days supply | Qty: 12 | Fill #0

## 2019-11-03 MED FILL — VYVANSE 30 MG CAPSULE: 30 | 30 days supply | Qty: 30 | Fill #0

## 2019-12-02 MED FILL — VYVANSE 30 MG CAPSULE: 30 | 30 days supply | Qty: 30 | Fill #0

## 2019-12-10 MED FILL — SERTRALINE HCL 100 MG TAB: 100 | 90 days supply | Qty: 90 | Fill #0

## 2019-12-20 MED FILL — CABERGOLINE 0.5 MG TABS: 0.5 | 84 days supply | Qty: 12 | Fill #1

## 2019-12-31 MED FILL — VYVANSE 30 MG CAPSULE: 30 | 30 days supply | Qty: 30 | Fill #0

## 2020-01-31 MED FILL — VYVANSE 30 MG CAPSULE: 30 | 30 days supply | Qty: 30 | Fill #0

## 2020-02-29 MED FILL — VYVANSE 30 MG CAPSULE: 30 | 30 days supply | Qty: 30 | Fill #0

## 2020-03-02 MED FILL — SERTRALINE HCL 100 MG TAB: 100 | 90 days supply | Qty: 90 | Fill #0

## 2020-03-02 MED FILL — CABERGOLINE 0.5 MG TABS: 0.5 | 84 days supply | Qty: 12 | Fill #2

## 2020-03-03 ENCOUNTER — Ambulatory Visit: Payer: No Typology Code available for payment source | Admitting: Internal Medicine

## 2020-03-29 MED FILL — VYVANSE 30 MG CAPSULE: 30 | 30 days supply | Qty: 30 | Fill #0

## 2020-04-07 ENCOUNTER — Encounter: Payer: Self-pay | Admitting: Internal Medicine

## 2020-04-07 ENCOUNTER — Ambulatory Visit (INDEPENDENT_AMBULATORY_CARE_PROVIDER_SITE_OTHER): Payer: No Typology Code available for payment source | Admitting: Internal Medicine

## 2020-04-07 ENCOUNTER — Other Ambulatory Visit: Payer: Self-pay

## 2020-04-07 VITALS — BP 112/70 | HR 89 | Temp 98.1°F | Ht 66.0 in | Wt 155.8 lb

## 2020-04-07 DIAGNOSIS — J309 Allergic rhinitis, unspecified: Secondary | ICD-10-CM

## 2020-04-07 DIAGNOSIS — F4321 Adjustment disorder with depressed mood: Secondary | ICD-10-CM

## 2020-04-07 DIAGNOSIS — F419 Anxiety disorder, unspecified: Secondary | ICD-10-CM

## 2020-04-07 DIAGNOSIS — D369 Benign neoplasm, unspecified site: Secondary | ICD-10-CM

## 2020-04-07 DIAGNOSIS — F909 Attention-deficit hyperactivity disorder, unspecified type: Secondary | ICD-10-CM

## 2020-04-07 DIAGNOSIS — R490 Dysphonia: Secondary | ICD-10-CM

## 2020-04-07 DIAGNOSIS — R87619 Unspecified abnormal cytological findings in specimens from cervix uteri: Secondary | ICD-10-CM

## 2020-04-07 DIAGNOSIS — E221 Hyperprolactinemia: Secondary | ICD-10-CM

## 2020-04-07 DIAGNOSIS — Z1389 Encounter for screening for other disorder: Secondary | ICD-10-CM

## 2020-04-07 DIAGNOSIS — B977 Papillomavirus as the cause of diseases classified elsewhere: Secondary | ICD-10-CM

## 2020-04-07 DIAGNOSIS — Z0000 Encounter for general adult medical examination without abnormal findings: Secondary | ICD-10-CM

## 2020-04-07 DIAGNOSIS — Z13220 Encounter for screening for lipoid disorders: Secondary | ICD-10-CM

## 2020-04-07 DIAGNOSIS — Z1329 Encounter for screening for other suspected endocrine disorder: Secondary | ICD-10-CM

## 2020-04-07 MED ORDER — SALINE SPRAY 0.65 % NA SOLN: 1.0000 | Freq: Every day | NASAL | 12 refills | Status: AC

## 2020-04-07 MED ORDER — AZELASTINE HCL 0.1 % NA SOLN: 1.0000 | Freq: Two times a day (BID) | NASAL | 12 refills | Status: DC

## 2020-04-07 MED ORDER — FLUTICASONE PROPIONATE 50 MCG/ACT NA SUSP: 2.0000 | Freq: Every day | NASAL | 11 refills | Status: DC

## 2020-04-07 NOTE — Patient Instructions (Addendum)
xyzal for allergy pill otc  Debrox ear wax drops right ear 5-10 min 4-7 days    Postnasal Drip Postnasal drip is the feeling of mucus going down the back of your throat. Mucus is a slimy substance that moistens and cleans your nose and throat, as well as the air pockets in face bones near your forehead and cheeks (sinuses). Small amounts of mucus pass from your nose and sinuses down the back of your throat all the time. This is normal. When you produce too much mucus or the mucus gets too thick, you can feel it. Some common causes of postnasal drip include:  Having more mucus because of: ? A cold or the flu. ? Allergies. ? Cold air. ? Certain medicines.  Having more mucus that is thicker because of: ? A sinus or nasal infection. ? Dry air. ? A food allergy. Follow these instructions at home: Relieving discomfort   Gargle with a salt-water mixture 3-4 times a day or as needed. To make a salt-water mixture, completely dissolve -1 tsp of salt in 1 cup of warm water.  If the air in your home is dry, use a humidifier to add moisture to the air.  Use a saline spray or container (neti pot) to flush out the nose (nasal irrigation). These methods can help clear away mucus and keep the nasal passages moist. General instructions  Take over-the-counter and prescription medicines only as told by your health care provider.  Follow instructions from your health care provider about eating or drinking restrictions. You may need to avoid caffeine.  Avoid things that you know you are allergic to (allergens), like dust, mold, pollen, pets, or certain foods.  Drink enough fluid to keep your urine pale yellow.  Keep all follow-up visits as told by your health care provider. This is important. Contact a health care provider if:  You have a fever.  You have a sore throat.  You have difficulty swallowing.  You have headache.  You have sinus pain.  You have a cough that does not go away.   The mucus from your nose becomes thick and is green or yellow in color.  You have cold or flu symptoms that last more than 10 days. Summary  Postnasal drip is the feeling of mucus going down the back of your throat.  If your health care provider approves, use nasal irrigation or a nasal spray 2?4 times a day.  Avoid things that you know you are allergic to (allergens), like dust, mold, pollen, pets, or certain foods. This information is not intended to replace advice given to you by your health care provider. Make sure you discuss any questions you have with your health care provider. Document Revised: 04/09/2019 Document Reviewed: 03/31/2017 Elsevier Patient Education  [place].   Hoarseness  Hoarseness, also called dysphonia, is any abnormal change in your voice that can make it difficult to speak. Your voice may sound raspy, breathy, or strained. Hoarseness is caused by a problem with your vocal cords (vocal folds). These are two bands of tissue inside your voice box (larynx). When you speak, your vocal cords move back and forth to create sound. The surfaces of your vocal cords need to be smooth for your voice to sound clear. Swelling or lumps on your vocal cords can cause hoarseness. Common causes of vocal cord problems include:  Infection in the nose, throat, and upper air passages (upper respiratory infection).  A long-term cough.  Straining or overusing your  voice.  Smoking, or exposure to secondhand smoke.  Allergies.  Medication side effects.  Vocal cord growths.  Vocal cord injuries.  Stomach acids that move up in your throat and irritate your vocal cords (gastroesophageal reflux).  Diseases that affect the nervous system, such as a stroke or Parkinson's disease. Follow these instructions at home: Watch your condition for any changes. To ease discomfort and protect your vocal cords:  Rest your voice.  Do not whisper. Whispering can cause muscle strain.   Do not speak in a loud or harsh voice.  Avoid coughing or clearing your throat.  Do not use any products that contain nicotine or tobacco, such as cigarettes and e-cigarettes. If you need help quitting, ask your health care provider.  Avoid secondhand smoke.  Do not eat foods that give you heartburn, such as spicy or acidic foods like hot peppers and orange juice. Heartburn can make gastroesophageal reflux worse.  Do not drink beverages that contain caffeine (coffee, tea, or soft drinks) or alcohol (beer, wine, or liquor).  Drink enough fluid to keep your urine pale yellow.  Use a humidifier if the air in your home is dry. If recommended by your health care provider, schedule an appointment with a speech-language specialist. This specialist may give you methods to try that can help you avoid misusing your voice. Contact a health care provider if:  You have hoarseness that lasts longer than 3 weeks.  You almost lose or completely lose your voice for more than 3 days.  You have pain when you swallow or try to talk.  You feel a lump in your neck. Get help right away if:  You have trouble swallowing.  You feel like you are choking when you swallow.  You cough up blood or vomit blood.  You have trouble breathing.  You choke, cannot swallow, or cannot breathe if you lie flat.  You notice swelling or a rash on your body, face, or tongue. Summary  Hoarseness, also called dysphonia, is any abnormal change in your voice that can make it difficult to speak. Your voice may sound raspy, breathy, or strained.  Hoarseness is caused by a problem with your vocal cords (vocal folds).  Do not speak in a loud or harsh voice, use nicotine or tobacco products, or eat foods that give you heartburn.  If recommended by your health care provider, meet with a speech-language specialist. This information is not intended to replace advice given to you by your health care provider. Make sure you  discuss any questions you have with your health care provider. Document Revised: 11/28/2017 Document Reviewed: 09/12/2017 Elsevier Patient Education  [place].

## 2020-04-07 NOTE — Progress Notes (Addendum)
Chief Complaint  Patient presents with  . Annual Exam   Annual  1. ADHD/anxiety/depression doing well on zoloft 100 mg qd, vyvanse 30 mg qd Dr. Nicolasa Evans needs referral for insurance  2. Pap upcoming [facility] needs referral  3. 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 Buddy Evans needs referral on this 2x per week and will get another MRI 09/02/20 breast discharge resolved and also urination at night per pt  4. Hoarse today happens 1x per year with allergies tried zyrtec, benadryl and sudafed she feels like she has PND which is causing hoarseness and not GERD    Review of Systems  Constitutional: Negative for weight loss.  HENT:       +hoarseness   Eyes: Negative for blurred vision.  Respiratory: Negative for cough and shortness of breath.   Cardiovascular: Negative for chest pain.  Gastrointestinal: Negative for abdominal pain and constipation.  Skin: Negative for rash.  Neurological: Negative for headaches.  Psychiatric/Behavioral: Negative for depression. The patient is not nervous/anxious.    Past Medical History:  Diagnosis Date  . Abnormal Pap smear of cervix    ASCUS, +HPV  . Anxiety   . Bunion, left foot   . Chicken pox   . Constipation   . Constipation   . Genital herpes   . PTSD (post-traumatic stress disorder)    previous marital issues (ex husband)   Past Surgical History:  Procedure Laterality Date  . APPENDECTOMY  2003  . TUBAL LIGATION N/A 06/28/2016   Procedure: POST PARTUM TUBAL LIGATION;  Surgeon: Suzanne Nearing, MD;  Location: [facility] OR[facility];  Service: Gynecology;  Laterality: N/A;   Family History  Problem Relation Age of Onset  . Heart disease Maternal Grandmother   . Hyperlipidemia Maternal Grandmother   . Stroke Maternal Grandmother   . Hyperlipidemia Mother   . Obesity Mother   . Alcohol abuse Paternal Grandfather   . Obesity Father   . Obesity Sister   . Obesity Brother   . Lung cancer Maternal Grandfather        smoker  .  Colon cancer Neg Hx   . Colitis Neg Hx   . Crohn's disease Neg Hx   . Diabetes Neg Hx   . Kidney disease Neg Hx   . Liver disease Neg Hx    Social History   Socioeconomic History  . Marital status: Married    Spouse name: Not on file  . Number of children: 2  . Years of education: 40  . Highest education level: Not on file  Occupational History  . Occupation: Designer, jewellery    Comment: [employer]  . Smoking status: Former Smoker    Packs/day: 1.00    Years: 8.00    Pack years: 8.00    Quit date: 12/30/2004    Years since quitting: 15.2  . Smokeless tobacco: Never Used  . Tobacco comment: in college only  Substance and Sexual Activity  . Alcohol use: Yes    Alcohol/week: 2.0 standard drinks    Types: 2 Standard drinks or equivalent per week    Comment: 2 or less drinks weekly  . Drug use: No  . Sexual activity: Yes    Partners: Male    Birth control/protection: Surgical  Other Topics Concern  . Not on file  Social History Narrative   Suzanne Evans grew up in [place]. 3 kids and 1 step son. Suzanne Evans is currently divorced and re-married. She  attended [employer] for her Bachelors in Nursing. She then attended [employer] for her Masters in Adult Oncology Nurse Practitioner. She is working at [employer]. She spends all of her spare time with her children husband and dog      Caffeine - 2 cups of coffee daily then water   Exercise - active   Social Determinants of Health   Financial Resource Strain:   . Difficulty of Paying Living Expenses:   Food Insecurity:   . Worried About Charity fundraiser in the Last Year:   . Arboriculturist in the Last Year:   Transportation Needs:   . Film/video editor (Medical):   Marland Kitchen Lack of Transportation (Non-Medical):   Physical Activity:   . Days of Exercise per Week:   . Minutes of Exercise per Session:   Stress:   . Feeling of Stress :   Social Connections:   . Frequency of  Communication with Friends and Family:   . Frequency of Social Gatherings with Friends and Family:   . Attends Religious Services:   . Active Member of Clubs or Organizations:   . Attends Archivist Meetings:   Marland Kitchen Marital Status:   Intimate Partner Violence:   . Fear of Current or Ex-Partner:   . Emotionally Abused:   Marland Kitchen Physically Abused:   . Sexually Abused:    Current Meds  Medication Sig  . Ascorbic Acid (VITAMIN C PO) Take by mouth. Pt takes liquid  . cabergoline (DOSTINEX) 0.5 MG tablet Take 0.25 mg by mouth 2 (two) times a week.  . Cetirizine HCl (ZYRTEC ALLERGY PO) Take by mouth.  . Chlorpheniramine-Phenylephrine (SUDAFED PE SINUS/ALLERGY PO) Take by mouth.  . Cholecalciferol (VITAMIN D3) 3000 units TABS Take by mouth daily.  . clobetasol (OLUX) 0.05 % topical foam Apply topically 2 (two) times daily. Prn  . Folic Acid (FOLATE PO) Take by mouth.  Marland Kitchen ketoconazole (NIZORAL) 2 % shampoo Apply 1 application topically 2 (two) times a week. Let  Seat on x 5 + minutes  . Magnesium 400 MG CAPS Take by mouth daily.  . Multiple Vitamins-Minerals (ZINC PO) Take by mouth.  . sertraline (ZOLOFT) 100 MG tablet Take 100 mg by mouth daily.  . vitamin B-12 (CYANOCOBALAMIN) 1000 MCG tablet Take 1,000 mcg by mouth daily.  Marland Kitchen VYVANSE 30 MG capsule   . [DISCONTINUED] CABERGOLINE PO Take by mouth 2 (two) times a week.   . [DISCONTINUED] Multiple Vitamins-Minerals (ZINC PO) Take by mouth daily.  . [DISCONTINUED] Prenatal Vit w/Fe-Methylfol-FA (TL FOLATE PO) Take by mouth.  . [DISCONTINUED] sertraline (ZOLOFT) 50 MG tablet Take 1 tablet (50 mg total) by mouth daily. (Patient taking differently: Take 100 mg by mouth daily. )   Allergies  Allergen Reactions  . Sulfa Antibiotics Anaphylaxis  . Erythromycin Other (See Comments)    Unknown rxn, childhood allergy  . Iron Itching   No results found for this or any previous visit (from the past 2160 hour(s)). Objective  Body mass index is  25.15 kg/m. Wt Readings from Last 3 Encounters:  04/07/20 155 lb 12.8 oz (70.7 kg)  08/26/19 157 lb 6.4 oz (71.4 kg)  11/18/18 149 lb 12.8 oz (67.9 kg)   Temp Readings from Last 3 Encounters:  04/07/20 98.1 F (36.7 C) (Temporal)  08/26/19 98 F (36.7 C)  11/18/18 99.1 F (37.3 C) (Oral)   BP Readings from Last 3 Encounters:  04/07/20 112/70  08/26/19 104/66  11/18/18 102/64  Pulse Readings from Last 3 Encounters:  04/07/20 89  08/26/19 89  11/18/18 85    Physical Exam Vitals and nursing note reviewed.  Constitutional:      Appearance: Normal appearance. She is well-developed and well-groomed.  HENT:     Head: Normocephalic and atraumatic.  Eyes:     Conjunctiva/sclera: Conjunctivae normal.     Pupils: Pupils are equal, round, and reactive to light.  Cardiovascular:     Rate and Rhythm: Normal rate and regular rhythm.     Heart sounds: Normal heart sounds. No murmur.  Pulmonary:     Effort: Pulmonary effort is normal.     Breath sounds: Normal breath sounds.  Skin:    General: Skin is warm and dry.  Neurological:     General: No focal deficit present.     Mental Status: She is alert and oriented to person, place, and time. Mental status is at baseline.     Gait: Gait normal.  Psychiatric:        Attention and Perception: Attention and perception normal.        Mood and Affect: Mood and affect normal.        Speech: Speech normal.        Behavior: Behavior normal. Behavior is cooperative.        Thought Content: Thought content normal.        Cognition and Memory: Cognition and memory normal.        Judgment: Judgment normal.     Assessment  Plan  Annual physical exam continue exercise and healthy diet UTD flu Tdaputd4/6/17 covid 2/2  MMR immune 11/04/00 Hep B immune 08/26/05 TB Disc HPVwith OB/GYN[facility] nowDr. Murrell Redden Hep B immune and had MMR vaccine  Ob/gyn Saw 02/18/18 [facility]: -ASCUS HPV negative h/o HPV and  colposcopy 09/03/16 for lgsil f/u appt 08/24/18 Dr. Ouida Sills  -[facility] seeing 01/2019 had repeat pap ASCUS with hPV and colp with similar results 04/2019 and 05/25/2019 will undergo 1st LEEP -prev requested pt have ob/gyn [facility] records to [place] via fax A1C 4.9 today 08/26/19   Mammogram [facility]9/28/19 negative repeat age 33 y.o  H/o neg Hep C, HIV   Hoarse coivd could be 2/2 Allergic rhinitis, unspecified seasonality, unspecified trigger - Plan: azelastine (ASTELIN) 0.1 % nasal spray, fluticasone (FLONASE) 50 MCG/ACT nasal spray, sodium chloride (OCEAN) 0.65 % SOLN nasal spray Cont zyrtec  Disc xyzal   Situational depression - Plan: Ambulatory referral to Psychiatry  Anxiety - Plan: Ambulatory referral to Psychiatry  Attention deficit hyperactivity disorder (ADHD), unspecified ADHD type - Plan: Ambulatory referral to Psychiatry  Hyperprolactinemia ([place]) - Plan: Ambulatory referral to Endocrinology  Microadenoma - Plan: Ambulatory referral to Endocrinology  HPV in female - Plan: Ambulatory referral to Obstetrics / Gynecology  Abnormal cervical Papanicolaou smear, unspecified abnormal pap finding - Plan: Ambulatory referral to Obstetrics / Gynecology   Of note Saw Dr. Nicolasa Evans 05/28/2019 no change in meds  Dr. Nicolasa Evans 08/20/19 vyvanse 30 mg qd zoloft 50 mg qd  Saw psych 09/17/19 no change in meds vyvanse 30 mg qd   12/10/19 psych zoloft 100 mg, vyvanse 30 mg qd (GAD, ADHD)   12/30/19 psych appt adhd, anxiety on vyvanse 30 and zoloft 100 mg qd   Provider: Dr. Olivia Mackie McLean-Scocuzza-Internal Medicine

## 2020-05-05 ENCOUNTER — Encounter: Payer: Self-pay | Admitting: Internal Medicine

## 2020-05-05 MED FILL — VYVANSE 30 MG CAPSULE: 30 | 30 days supply | Qty: 30 | Fill #0

## 2020-05-08 NOTE — Telephone Encounter (Signed)
Pt brought in immunization form to be signed. Placed in colored folder up front

## 2020-05-15 ENCOUNTER — Telehealth: Payer: Self-pay | Admitting: Internal Medicine

## 2020-05-15 NOTE — Telephone Encounter (Signed)
Patient informed and verbalized understanding.  Copy of forms sent to scan and the original placed upfront for pick up.

## 2020-05-25 MED FILL — CABERGOLINE 0.5 MG TABS: 0.5 | 84 days supply | Qty: 12 | Fill #3

## 2020-05-25 MED FILL — SERTRALINE HCL 100 MG TAB: 100 | 90 days supply | Qty: 90 | Fill #0

## 2020-06-16 ENCOUNTER — Ambulatory Visit: Payer: No Typology Code available for payment source | Admitting: Internal Medicine

## 2020-06-23 ENCOUNTER — Encounter: Payer: Self-pay | Admitting: Internal Medicine

## 2020-06-25 LAB — HM PAP SMEAR: HM Pap smear: NORMAL

## 2020-06-26 ENCOUNTER — Other Ambulatory Visit: Payer: Self-pay | Admitting: Internal Medicine

## 2020-06-26 DIAGNOSIS — Z111 Encounter for screening for respiratory tuberculosis: Secondary | ICD-10-CM

## 2020-06-26 DIAGNOSIS — Z0184 Encounter for antibody response examination: Secondary | ICD-10-CM

## 2020-06-26 DIAGNOSIS — Z1159 Encounter for screening for other viral diseases: Secondary | ICD-10-CM

## 2020-06-30 MED FILL — SERTRALINE HCL 100 MG TABS: 100 | 90 days supply | Qty: 90 | Fill #0

## 2020-07-14 ENCOUNTER — Other Ambulatory Visit (HOSPITAL_COMMUNITY): Payer: Self-pay | Admitting: Psychiatry

## 2020-07-14 MED FILL — DEXTROAMP-AMP 10 MG TAB: 10 | 30 days supply | Qty: 30 | Fill #0

## 2020-07-14 MED FILL — VYVANSE 30 MG CAPSULE: 30 | 30 days supply | Qty: 30 | Fill #0

## 2020-07-16 ENCOUNTER — Other Ambulatory Visit: Payer: Self-pay | Admitting: Internal Medicine

## 2020-07-16 DIAGNOSIS — F909 Attention-deficit hyperactivity disorder, unspecified type: Secondary | ICD-10-CM

## 2020-07-16 MED ORDER — AMPHETAMINE-DEXTROAMPHETAMINE 10 MG PO TABS: 10.0000 mg | ORAL_TABLET | Freq: Every day | ORAL | 0 refills | Status: DC

## 2020-07-20 ENCOUNTER — Other Ambulatory Visit: Payer: Self-pay

## 2020-07-21 ENCOUNTER — Encounter: Payer: Self-pay | Admitting: Internal Medicine

## 2020-07-21 ENCOUNTER — Other Ambulatory Visit: Payer: Self-pay | Admitting: Internal Medicine

## 2020-07-21 ENCOUNTER — Other Ambulatory Visit (INDEPENDENT_AMBULATORY_CARE_PROVIDER_SITE_OTHER): Payer: No Typology Code available for payment source

## 2020-07-21 DIAGNOSIS — Z1329 Encounter for screening for other suspected endocrine disorder: Secondary | ICD-10-CM | POA: Diagnosis not present

## 2020-07-21 DIAGNOSIS — Z13220 Encounter for screening for lipoid disorders: Secondary | ICD-10-CM | POA: Diagnosis not present

## 2020-07-21 DIAGNOSIS — Z0000 Encounter for general adult medical examination without abnormal findings: Secondary | ICD-10-CM | POA: Diagnosis not present

## 2020-07-21 DIAGNOSIS — Z1389 Encounter for screening for other disorder: Secondary | ICD-10-CM

## 2020-07-21 DIAGNOSIS — Z1159 Encounter for screening for other viral diseases: Secondary | ICD-10-CM

## 2020-07-21 DIAGNOSIS — Z111 Encounter for screening for respiratory tuberculosis: Secondary | ICD-10-CM | POA: Diagnosis not present

## 2020-07-21 DIAGNOSIS — Z0184 Encounter for antibody response examination: Secondary | ICD-10-CM

## 2020-07-21 LAB — COMPREHENSIVE METABOLIC PANEL
ALT: 38 U/L — ABNORMAL HIGH (ref 0–35)
AST: 113 U/L — ABNORMAL HIGH (ref 0–37)
Albumin: 4.3 g/dL (ref 3.5–5.2)
Alkaline Phosphatase: 49 U/L (ref 39–117)
BUN: 11 mg/dL (ref 6–23)
CO2: 26 mEq/L (ref 19–32)
Calcium: 9.1 mg/dL (ref 8.4–10.5)
Chloride: 103 mEq/L (ref 96–112)
Creatinine, Ser: 0.78 mg/dL (ref 0.40–1.20)
GFR: 83.14 mL/min (ref >60.00)
Glucose, Bld: 92 mg/dL (ref 70–99)
Potassium: 3.7 mEq/L (ref 3.5–5.1)
Sodium: 137 mEq/L (ref 135–145)
Total Bilirubin: 0.5 mg/dL (ref 0.2–1.2)
Total Protein: 7.1 g/dL (ref 6.0–8.3)

## 2020-07-21 LAB — LIPID PANEL
Cholesterol: 159 mg/dL (ref 0–200)
HDL: 63.5 mg/dL (ref >39.00)
LDL Cholesterol: 84 mg/dL (ref 0–99)
NonHDL: 95.4
Total CHOL/HDL Ratio: 3
Triglycerides: 59 mg/dL (ref 0.0–149.0)
VLDL: 11.8 mg/dL (ref 0.0–40.0)

## 2020-07-21 LAB — CBC WITH DIFFERENTIAL/PLATELET
Basophils Absolute: 0 10*3/uL (ref 0.0–0.1)
Basophils Relative: 0.6 % (ref 0.0–3.0)
Eosinophils Absolute: 0.2 10*3/uL (ref 0.0–0.7)
Eosinophils Relative: 3.5 % (ref 0.0–5.0)
HCT: 38.3 % (ref 36.0–46.0)
Hemoglobin: 13.2 g/dL (ref 12.0–15.0)
Lymphocytes Relative: 34 % (ref 12.0–46.0)
Lymphs Abs: 2 10*3/uL (ref 0.7–4.0)
MCHC: 34.4 g/dL (ref 30.0–36.0)
MCV: 92.1 fl (ref 78.0–100.0)
Monocytes Absolute: 0.5 10*3/uL (ref 0.1–1.0)
Monocytes Relative: 7.7 % (ref 3.0–12.0)
Neutro Abs: 3.2 10*3/uL (ref 1.4–7.7)
Neutrophils Relative %: 54.2 % (ref 43.0–77.0)
Platelets: 314 10*3/uL (ref 150.0–400.0)
RBC: 4.16 Mil/uL (ref 3.87–5.11)
RDW: 13.4 % (ref 11.5–15.5)
WBC: 5.9 10*3/uL (ref 4.0–10.5)

## 2020-07-21 LAB — TSH: TSH: 1.57 u[IU]/mL (ref 0.35–4.50)

## 2020-07-24 ENCOUNTER — Encounter: Payer: Self-pay | Admitting: Internal Medicine

## 2020-07-24 ENCOUNTER — Telehealth: Payer: Self-pay | Admitting: Internal Medicine

## 2020-07-24 ENCOUNTER — Other Ambulatory Visit: Payer: Self-pay | Admitting: Internal Medicine

## 2020-07-24 ENCOUNTER — Other Ambulatory Visit: Payer: Self-pay

## 2020-07-24 DIAGNOSIS — R748 Abnormal levels of other serum enzymes: Secondary | ICD-10-CM

## 2020-07-24 LAB — QUANTIFERON-TB GOLD PLUS
Mitogen-NIL: >10 IU/mL
NIL: 0.03 IU/mL
QuantiFERON-TB Gold Plus: NEGATIVE
TB1-NIL: 0 IU/mL
TB2-NIL: 0 IU/mL

## 2020-07-24 LAB — HEPATITIS B SURFACE ANTIBODY, QUANTITATIVE: Hep B S AB Quant (Post): 436 m[IU]/mL (ref >=10)

## 2020-07-24 NOTE — Telephone Encounter (Signed)
Left message to return call. Labs ordered

## 2020-07-24 NOTE — Telephone Encounter (Signed)
See result note.  

## 2020-07-24 NOTE — Telephone Encounter (Signed)
-----   Message from Delorise Jackson, MD sent at 07/23/2020  6:35 PM EDT ----- Thyroid lab normal  Cholesterol normal Liver enzymes elevated  -is she drinking alcohol? Any supplements otc?  -Emmanuela Ghazi please order repeat liver enzymes/hepatic function panel in 2 weeks and sch appt for patient  -if still elevated I think she will need an ultrasound of her abdomen   Blood cts normal

## 2020-07-26 ENCOUNTER — Other Ambulatory Visit: Payer: Self-pay | Admitting: Internal Medicine

## 2020-07-26 ENCOUNTER — Telehealth: Payer: Self-pay | Admitting: Internal Medicine

## 2020-07-26 DIAGNOSIS — D352 Benign neoplasm of pituitary gland: Secondary | ICD-10-CM

## 2020-07-26 NOTE — Telephone Encounter (Signed)
Left message informing that these forms have been placed upfront.

## 2020-07-26 NOTE — Telephone Encounter (Signed)
Pt called and wanted to know if her health form and tb test results, etc were ready to be picked up

## 2020-08-01 NOTE — Addendum Note (Signed)
Addended by: Leeanne Rio on: 08/01/2020 08:56 AM   Modules accepted: Orders

## 2020-08-02 ENCOUNTER — Other Ambulatory Visit: Payer: Self-pay | Admitting: Internal Medicine

## 2020-08-02 ENCOUNTER — Telehealth: Payer: Self-pay | Admitting: Internal Medicine

## 2020-08-02 DIAGNOSIS — D352 Benign neoplasm of pituitary gland: Secondary | ICD-10-CM

## 2020-08-02 NOTE — Telephone Encounter (Signed)
Leeanne Rio, CMA  08/01/2020 8:57 AM EDT Back to Top    Pt works for [employer] (per chart). She can collect it anywhere but will need to be reordered based on where she is going.   Leeanne Rio, CMA  08/01/2020 8:55 AM EDT     No urine was received from patient.   Nino Glow McLean-Scocuzza, MD  07/31/2020 10:26 PM EDT     Yves Dill Can we move lab appt up week of 8/9th or 8/16th as I will be out of town 08/21/20 and if abnormal will need to follow up on elevated liver enzymes for the patient   Lab  was urine done? If so where are results please

## 2020-08-02 NOTE — Telephone Encounter (Signed)
Left message to return call. Needing to move up patient's lab appointment to the week of 08/07/20 or the week of 08/14/20.   Please schedule patient if she calls back in.

## 2020-08-04 NOTE — Telephone Encounter (Signed)
Called Suzanne Evans and rescheduled labs for 08/07/20. Suzanne Evans verbalized understanding and had no further questions.

## 2020-08-07 ENCOUNTER — Other Ambulatory Visit: Payer: Self-pay

## 2020-08-07 ENCOUNTER — Other Ambulatory Visit: Payer: No Typology Code available for payment source

## 2020-08-07 ENCOUNTER — Other Ambulatory Visit (INDEPENDENT_AMBULATORY_CARE_PROVIDER_SITE_OTHER): Payer: No Typology Code available for payment source

## 2020-08-07 DIAGNOSIS — R748 Abnormal levels of other serum enzymes: Secondary | ICD-10-CM

## 2020-08-07 LAB — HEPATIC FUNCTION PANEL
AG Ratio: 1.7 (calc) (ref 1.0–2.5)
ALT: 12 U/L (ref 6–29)
AST: 18 U/L (ref 10–30)
Albumin: 4.4 g/dL (ref 3.6–5.1)
Alkaline phosphatase (APISO): 50 U/L (ref 31–125)
Bilirubin, Direct: 0.1 mg/dL (ref 0.0–0.2)
Globulin: 2.6 g/dL (calc) (ref 1.9–3.7)
Indirect Bilirubin: 0.3 mg/dL (calc) (ref 0.2–1.2)
Total Bilirubin: 0.4 mg/dL (ref 0.2–1.2)
Total Protein: 7 g/dL (ref 6.1–8.1)

## 2020-08-11 MED FILL — VYVANSE 30 MG CAPSULE: 30 | 30 days supply | Qty: 30 | Fill #0

## 2020-08-11 MED FILL — AMPHETAMINE SALTS 10 MG: 10 | 30 days supply | Qty: 30 | Fill #0

## 2020-08-21 ENCOUNTER — Other Ambulatory Visit: Payer: No Typology Code available for payment source

## 2020-08-23 MED FILL — CABERGOLINE 0.5 MG TABS: 0.5 | 84 days supply | Qty: 12 | Fill #4

## 2020-08-23 MED FILL — AMPHETAMINE SALTS 10 MG: 10 | 30 days supply | Qty: 30 | Fill #0

## 2020-08-23 MED FILL — VYVANSE 30 MG CAPSULE: 30 | 30 days supply | Qty: 30 | Fill #0

## 2020-08-26 ENCOUNTER — Ambulatory Visit
Admission: RE | Admit: 2020-08-26 | Discharge: 2020-08-26 | Disposition: A | Discharge status: Home / Self Care | Payer: No Typology Code available for payment source | Source: Ambulatory Visit | Attending: Internal Medicine | Admitting: Internal Medicine

## 2020-08-26 DIAGNOSIS — D352 Benign neoplasm of pituitary gland: Secondary | ICD-10-CM

## 2020-08-26 MED ORDER — GADOBENATE DIMEGLUMINE 529 MG/ML IV SOLN
10.0000 mL | Freq: Once | INTRAVENOUS | Status: AC | PRN
  Administered 2020-08-26: 10 mL via INTRAVENOUS

## 2020-09-22 MED FILL — SERTRALINE HCL 100 MG TABS: 100 | 90 days supply | Qty: 90 | Fill #1

## 2020-09-26 ENCOUNTER — Encounter: Payer: Self-pay | Admitting: Internal Medicine

## 2020-09-26 ENCOUNTER — Ambulatory Visit (INDEPENDENT_AMBULATORY_CARE_PROVIDER_SITE_OTHER): Payer: No Typology Code available for payment source | Admitting: Internal Medicine

## 2020-09-26 ENCOUNTER — Other Ambulatory Visit: Payer: Self-pay

## 2020-09-26 VITALS — BP 114/80 | HR 79 | Temp 98.5°F | Ht 66.0 in | Wt 166.8 lb

## 2020-09-26 DIAGNOSIS — N870 Mild cervical dysplasia: Secondary | ICD-10-CM

## 2020-09-26 DIAGNOSIS — Z1389 Encounter for screening for other disorder: Secondary | ICD-10-CM

## 2020-09-26 DIAGNOSIS — F4321 Adjustment disorder with depressed mood: Secondary | ICD-10-CM

## 2020-09-26 DIAGNOSIS — J9801 Acute bronchospasm: Secondary | ICD-10-CM

## 2020-09-26 DIAGNOSIS — R87612 Low grade squamous intraepithelial lesion on cytologic smear of cervix (LGSIL): Secondary | ICD-10-CM

## 2020-09-26 DIAGNOSIS — F419 Anxiety disorder, unspecified: Secondary | ICD-10-CM

## 2020-09-26 DIAGNOSIS — Z0000 Encounter for general adult medical examination without abnormal findings: Secondary | ICD-10-CM

## 2020-09-26 DIAGNOSIS — Z111 Encounter for screening for respiratory tuberculosis: Secondary | ICD-10-CM

## 2020-09-26 DIAGNOSIS — R7989 Other specified abnormal findings of blood chemistry: Secondary | ICD-10-CM

## 2020-09-26 DIAGNOSIS — E221 Hyperprolactinemia: Secondary | ICD-10-CM

## 2020-09-26 DIAGNOSIS — Z13220 Encounter for screening for lipoid disorders: Secondary | ICD-10-CM

## 2020-09-26 DIAGNOSIS — Z1329 Encounter for screening for other suspected endocrine disorder: Secondary | ICD-10-CM

## 2020-09-26 DIAGNOSIS — R87610 Atypical squamous cells of undetermined significance on cytologic smear of cervix (ASC-US): Secondary | ICD-10-CM

## 2020-09-26 DIAGNOSIS — F909 Attention-deficit hyperactivity disorder, unspecified type: Secondary | ICD-10-CM

## 2020-09-26 DIAGNOSIS — D369 Benign neoplasm, unspecified site: Secondary | ICD-10-CM

## 2020-09-26 DIAGNOSIS — B977 Papillomavirus as the cause of diseases classified elsewhere: Secondary | ICD-10-CM

## 2020-09-26 MED ORDER — ALBUTEROL SULFATE HFA 108 (90 BASE) MCG/ACT IN AERS: 1.0000 | INHALATION_SPRAY | Freq: Four times a day (QID) | RESPIRATORY_TRACT | 12 refills | Status: DC | PRN

## 2020-09-26 MED FILL — ALBUTEROL SULFATE HFA 108 (: 108 (90 BAS | 25 days supply | Qty: 9 | Fill #0

## 2020-09-26 NOTE — Progress Notes (Signed)
Chief Complaint  Patient presents with   Follow-up   F/u  1. 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 Nicolasa Ducking doing well   2. H/o HPV s/p colp, LEEP and per pt normal pap 03/2020 [facility] will get records  3. Allergies with bronchospasm wants refill of albuterol inhaler    Review of Systems  Constitutional: Negative for weight loss.  HENT: Negative for hearing loss.   Eyes: Negative for blurred vision.  Respiratory: Negative for shortness of breath.   Cardiovascular: Negative for chest pain.  Gastrointestinal: Negative for abdominal pain.  Musculoskeletal: Negative for falls.  Skin: Negative for rash.  Neurological: Negative for headaches.  Psychiatric/Behavioral: Negative for depression. The patient is not nervous/anxious.    Past Medical History:  Diagnosis Date   Abnormal Pap smear of cervix    ASCUS, +HPV   Anxiety    Bunion, left foot    Chicken pox    Constipation    Constipation    Genital herpes    Ovarian cyst    left s/p rupture imaing 2020/2021   PTSD (post-traumatic stress disorder)    previous marital issues (ex husband)   Past Surgical History:  Procedure Laterality Date   APPENDECTOMY  2003   TUBAL LIGATION N/A 06/28/2016   Procedure: POST PARTUM TUBAL LIGATION;  Surgeon: Boykin Nearing, MD;  Location: [facility] ORS;  Service: Gynecology;  Laterality: N/A;   Family History  Problem Relation Age of Onset   Heart disease Maternal Grandmother    Hyperlipidemia Maternal Grandmother    Stroke Maternal Grandmother    Hyperlipidemia Mother    Obesity Mother    Alcohol abuse Paternal Grandfather    Obesity Father    Obesity Sister    Crohn's disease Sister        s/p colectomy  age 65 y.o   Other Sister        covid 28   Obesity Brother    Lung cancer Maternal Grandfather        smoker   Colon cancer Neg Hx    Colitis Neg Hx    Diabetes Neg Hx    Kidney disease Neg Hx    Liver disease Neg Hx    Social History   Socioeconomic History   Marital status: Married    Spouse name: Not on file   Number of children: 2   Years of education: 18   Highest education level: Not on file  Occupational History   Occupation: Designer, jewellery    Comment: [place] Use   Smoking status: Former Smoker    Packs/day: 1.00    Years: 8.00    Pack years: 8.00    Quit date: 12/30/2004    Years since quitting: 15.7   Smokeless tobacco: Never Used   Tobacco comment: in college only  Substance and Sexual Activity   Alcohol use: Yes    Alcohol/week: 2.0 standard drinks    Types: 2 Standard drinks or equivalent per week    Comment: 2 or less drinks weekly   Drug use: No   Sexual activity: Yes    Partners: Male    Birth control/protection: Surgical  Other Topics  Concern   Not on file  Social History Narrative   Dashae grew up in [place]. 3 kids and 1 step son. Octa is currently divorced and re-married. She attended [employer] for her Bachelors in Nursing. She then attended [employer] for her Masters in Adult Oncology Nurse Practitioner. She is working at [employer]. She spends all of her spare time with her children husband and dog      Caffeine - 2 cups of coffee daily then water   Exercise - active   APP of year 2021 won award    Social Determinants of Radio broadcast assistant Strain:    Difficulty of Paying Living Expenses: Not on file  Food Insecurity:    Worried About Charity fundraiser in the Last Year: Not on file   [employer] of Food in the Last Year: Not on file  Transportation Needs:    Lack of Transportation (Medical): Not on file   Lack of Transportation (Non-Medical): Not on file   Physical Activity:    Days of Exercise per Week: Not on file   Minutes of Exercise per Session: Not on file  Stress:    Feeling of Stress : Not on file  Social Connections:    Frequency of Communication with Friends and Family: Not on file   Frequency of Social Gatherings with Friends and Family: Not on file   Attends Religious Services: Not on file   Active Member of Clubs or Organizations: Not on file   Attends Archivist Meetings: Not on file   Marital Status: Not on file  Intimate Partner Violence:    Fear of Current or Ex-Partner: Not on file   Emotionally Abused: Not on file   Physically Abused: Not on file   Sexually Abused: Not on file   Current Meds  Medication Sig   albuterol (VENTOLIN HFA) 108 (90 Base) MCG/ACT inhaler Inhale 1-2 puffs into the lungs every 6 (six) hours as needed for wheezing or shortness of breath.   amphetamine-dextroamphetamine (ADDERALL) 10 MG tablet Take 1 tablet (10 mg total) by mouth daily at 6 (six) AM. Before 3 pm Dr. Nicolasa Ducking   Ascorbic Acid (VITAMIN C PO) Take by mouth. Pt takes liquid   azelastine (ASTELIN) 0.1 % nasal spray Place 1-2 sprays into both nostrils 2 (two) times daily. Use in each nostril as directed   cabergoline (DOSTINEX) 0.5 MG tablet Take 0.25 mg by mouth 2 (two) times a week.   Cetirizine HCl (ZYRTEC ALLERGY PO) Take by mouth.   Chlorpheniramine-Phenylephrine (SUDAFED PE SINUS/ALLERGY PO) Take by mouth.   Cholecalciferol (VITAMIN D3) 3000 units TABS Take by mouth daily.   clobetasol (OLUX) 0.05 % topical foam Apply topically 2 (two) times daily. Prn   fluticasone (FLONASE) 50 MCG/ACT nasal spray Place 2 sprays into both nostrils daily.   Folic Acid (FOLATE PO) Take by mouth.   ketoconazole (NIZORAL) 2 % shampoo Apply 1 application topically 2 (two) times a week. Let  Seat on x 5 + minutes   Magnesium 400 MG CAPS Take by mouth daily.   Multiple Vitamins-Minerals (ZINC PO) Take by mouth.    sertraline (ZOLOFT) 100 MG tablet Take 100 mg by mouth daily.   sodium chloride (OCEAN) 0.65 % SOLN nasal spray Place 1-2 sprays into both nostrils daily.   vitamin B-12 (CYANOCOBALAMIN) 1000 MCG tablet Take 1,000 mcg by mouth daily.   VYVANSE 30 MG capsule    [DISCONTINUED] albuterol (PROVENTIL HFA;VENTOLIN  HFA) 108 (90 Base) MCG/ACT inhaler Inhale 1-2 puffs into the lungs every 6 (six) hours as needed for wheezing or shortness of breath.   Allergies  Allergen Reactions   Sulfa Antibiotics Anaphylaxis   Erythromycin Other (See Comments)    Unknown rxn, childhood allergy   Iron Itching   Recent Results (from the past 2160 hour(s))  QuantiFERON-TB Gold Plus     Status: None   Collection Time: 07/21/20 11:18 AM  Result Value Ref Range   QuantiFERON-TB Gold Plus NEGATIVE NEGATIVE    Comment: Negative test result. M. tuberculosis complex  infection unlikely.    NIL 0.03 IU/mL   Mitogen-NIL >10.00 IU/mL   TB1-NIL 0.00 IU/mL   TB2-NIL 0.00 IU/mL    Comment: . The Nil tube value reflects the background interferon gamma immune response of the patient's blood sample. This value has been subtracted from the patient's displayed TB and Mitogen results. . Lower than expected results with the Mitogen tube prevent false-negative Quantiferon readings by detecting a patient with a potential immune suppressive condition and/or suboptimal pre-analytical specimen handling. . The TB1 Antigen tube is coated with the M. tuberculosis-specific antigens designed to elicit responses from TB antigen primed CD4+ helper T-lymphocytes. . The TB2 Antigen tube is coated with the M. tuberculosis-specific antigens designed to elicit responses from TB antigen primed CD4+ helper and CD8+ cytotoxic T-lymphocytes. . For additional information, please refer to https://education.questdiagn[facility].com/faq/FAQ204 (This link is being provided for informational/ educational purposes only.) .    Hepatitis B surface antibody,quantitative     Status: None   Collection Time: 07/21/20 11:18 AM  Result Value Ref Range   Hepatitis B-Post 436 > OR = 10 mIU/mL    Comment: . Patient has immunity to hepatitis B virus. . For additional information, please refer to http://education.[facility].com/faq/FAQ105 (This link is being provided for informational/ educational purposes only).   TSH     Status: None   Collection Time: 07/21/20 11:18 AM  Result Value Ref Range   TSH 1.57 0.35 - 4.50 uIU/mL  Lipid panel     Status: None   Collection Time: 07/21/20 11:18 AM  Result Value Ref Range   Cholesterol 159 0 - 200 mg/dL    Comment: ATP III Classification       Desirable:  < 200 mg/dL               Borderline High:  200 - 239 mg/dL          High:  > = 240 mg/dL   Triglycerides 59.0 0 - 149 mg/dL    Comment: Normal:  <150 mg/dLBorderline High:  150 - 199 mg/dL   HDL 63.50 >39.00 mg/dL   VLDL 11.8 0.0 - 40.0 mg/dL   LDL Cholesterol 84 0 - 99 mg/dL   Total CHOL/HDL Ratio 3     Comment:                Men          Women1/2 Average Risk     3.4          3.3Average Risk          5.0          4.42X Average Risk          9.6          7.13X Average Risk          15.0          11.0  NonHDL 95.40     Comment: NOTE:  Non-HDL goal should be 30 mg/dL higher than patient's LDL goal (i.e. LDL goal of < 70 mg/dL, would have non-HDL goal of < 100 mg/dL)  CBC with Differential/Platelet     Status: None   Collection Time: 07/21/20 11:18 AM  Result Value Ref Range   WBC 5.9 4.0 - 10.5 K/uL   RBC 4.16 3.87 - 5.11 Mil/uL   Hemoglobin 13.2 12.0 - 15.0 g/dL   HCT 38.3 36 - 46 %   MCV 92.1 78.0 - 100.0 fl   MCHC 34.4 30.0 - 36.0 g/dL   RDW 13.4 11.5 - 15.5 %   Platelets 314.0 150 - 400 K/uL   Neutrophils Relative % 54.2 43 - 77 %   Lymphocytes Relative 34.0 12 - 46 %   Monocytes Relative 7.7 3 - 12 %   Eosinophils Relative 3.5 0 - 5 %   Basophils Relative 0.6 0 - 3 %   Neutro  Abs 3.2 1.4 - 7.7 K/uL   Lymphs Abs 2.0 0.7 - 4.0 K/uL   Monocytes Absolute 0.5 0 - 1 K/uL   Eosinophils Absolute 0.2 0 - 0 K/uL   Basophils Absolute 0.0 0 - 0 K/uL  Comprehensive metabolic panel     Status: Abnormal   Collection Time: 07/21/20 11:18 AM  Result Value Ref Range   Sodium 137 135 - 145 mEq/L   Potassium 3.7 3.5 - 5.1 mEq/L   Chloride 103 96 - 112 mEq/L   CO2 26 19 - 32 mEq/L   Glucose, Bld 92 70 - 99 mg/dL   BUN 11 6 - 23 mg/dL   Creatinine, Ser 0.78 0.40 - 1.20 mg/dL   Total Bilirubin 0.5 0.2 - 1.2 mg/dL   Alkaline Phosphatase 49 39 - 117 U/L   AST 113 (H) 0 - 37 U/L   ALT 38 (H) 0 - 35 U/L   Total Protein 7.1 6.0 - 8.3 g/dL   Albumin 4.3 3.5 - 5.2 g/dL   GFR 83.14 >60.00 mL/min   Calcium 9.1 8.4 - 10.5 mg/dL  Hepatic function panel     Status: None   Collection Time: 08/07/20 10:54 AM  Result Value Ref Range   Total Protein 7.0 6.1 - 8.1 g/dL   Albumin 4.4 3.6 - 5.1 g/dL   Globulin 2.6 1.9 - 3.7 g/dL (calc)   AG Ratio 1.7 1.0 - 2.5 (calc)   Total Bilirubin 0.4 0.2 - 1.2 mg/dL   Bilirubin, Direct 0.1 0.0 - 0.2 mg/dL   Indirect Bilirubin 0.3 0.2 - 1.2 mg/dL (calc)   Alkaline phosphatase (APISO) 50 31 - 125 U/L   AST 18 10 - 30 U/L   ALT 12 6 - 29 U/L   Objective  Body mass index is 26.92 kg/m. Wt Readings from Last 3 Encounters:  09/26/20 166 lb 12.8 oz (75.7 kg)  04/07/20 155 lb 12.8 oz (70.7 kg)  08/26/19 157 lb 6.4 oz (71.4 kg)   Temp Readings from Last 3 Encounters:  09/26/20 98.5 F (36.9 C) (Oral)  04/07/20 98.1 F (36.7 C) (Temporal)  08/26/19 98 F (36.7 C)   BP Readings from Last 3 Encounters:  09/26/20 114/80  04/07/20 112/70  08/26/19 104/66   Pulse Readings from Last 3 Encounters:  09/26/20 79  04/07/20 89  08/26/19 89    Physical Exam Vitals and nursing note reviewed.  Constitutional:      Appearance: Normal appearance. She is well-developed and well-groomed.  HENT:  Head: Normocephalic and atraumatic.  Eyes:      Conjunctiva/sclera: Conjunctivae normal.     Pupils: Pupils are equal, round, and reactive to light.  Cardiovascular:     Rate and Rhythm: Normal rate and regular rhythm.     Heart sounds: Normal heart sounds. No murmur heard.   Pulmonary:     Effort: Pulmonary effort is normal.     Breath sounds: Normal breath sounds.  Skin:    General: Skin is warm and dry.  Neurological:     General: No focal deficit present.     Mental Status: She is alert and oriented to person, place, and time. Mental status is at baseline.     Gait: Gait normal.  Psychiatric:        Attention and Perception: Attention and perception normal.        Mood and Affect: Mood and affect normal.        Speech: Speech normal.        Behavior: Behavior normal. Behavior is cooperative.        Thought Content: Thought content normal.        Cognition and Memory: Cognition and memory normal.        Judgment: Judgment normal.     Assessment  Plan  Anxiety/depression/adhd  -cont meds  Bronchospasm - Plan: albuterol (VENTOLIN HFA) 108 (90 Base) MCG/ACT inhaler  HPV in female Papanicolaou smear of cervix with low grade squamous intraepithelial lesion (LGSIL) CIN I (cervical intraepithelial neoplasia I) Atypical squamous cells of undetermined significance on cytologic smear of cervix (ASC-US)   Hyperprolactinemia (HCC) - Plan: Prolactin   HM continue exercise and healthy diet UTD flu9/2021 Tdaputd4/6/17 covid 2/2 2nd dose LAD, fatigue, fevers, chills will get 3rd booster  MMR immune 11/04/00 Hep B immune 08/26/05 TB neg  OB[facility]N [facility] nowDr. Murrell Redden Hep B immune and had MMR vaccine  A1C 4.9 today 08/26/19  Mammogram [facility]9/28/19 negative repeat age 68 y.o  Colonoscopy age 16 y.o   H/o neg Hep C, HIV   Hoarse resolved since last visit   Hyperprolactinemia ([place]) - Plan: Ambulatory referral to Endocrinology  Microadenoma - Plan: Ambulatory referral to  Endocrinology     Provider: Dr. Olivia Mackie McLean-[facility]oc[facility]

## 2020-09-26 NOTE — Patient Instructions (Addendum)
Magnesium 250 mg daily  Get your eyes checked [facility]  Consider neurology Dr. Jaynee Eagles

## 2020-09-27 MED FILL — AMPHETAMINE SALTS 10 MG: 10 | 30 days supply | Qty: 30 | Fill #0

## 2020-09-27 MED FILL — VYVANSE 30 MG CAPSULE: 30 | 30 days supply | Qty: 30 | Fill #0

## 2020-10-03 ENCOUNTER — Encounter: Payer: Self-pay | Admitting: Internal Medicine

## 2020-10-03 ENCOUNTER — Other Ambulatory Visit (HOSPITAL_COMMUNITY): Payer: Self-pay | Admitting: Psychiatry

## 2020-10-15 ENCOUNTER — Other Ambulatory Visit: Payer: Self-pay | Admitting: Infectious Diseases

## 2020-10-15 DIAGNOSIS — Z6826 Body mass index (BMI) 26.0-26.9, adult: Secondary | ICD-10-CM | POA: Insufficient documentation

## 2020-10-15 DIAGNOSIS — Z20822 Contact with and (suspected) exposure to covid-19: Secondary | ICD-10-CM

## 2020-10-15 NOTE — Progress Notes (Signed)
I connected by phone with Suzanne Evans on 10/15/2020 at 10:54 AM to discuss the potential use of a new treatment for mild to moderate COVID-19 viral infection in non-hospitalized patients.  This patient is a 39 y.o. female that meets the FDA criteria for Emergency Use Authorization of COVID monoclonal antibody casirivimab/imdevimab or bamlanivimab/eteseviamb.  Has a (+) direct SARS-CoV-2 viral test result  Has mild or moderate COVID-19   Is NOT hospitalized due to COVID-19  Is within 10 days of symptom onset  Has at least one of the high risk factor(s) for progression to severe COVID-19 and/or hospitalization as defined in EUA.  Specific high risk criteria : BMI > 25   I have spoken and communicated the following to the patient or parent/caregiver regarding COVID monoclonal antibody treatment:  1. FDA has authorized the emergency use for the treatment of mild to moderate COVID-19 in adults and pediatric patients with positive results of direct SARS-CoV-2 viral testing who are 58 years of age and older weighing at least 40 kg, and who are at high risk for progressing to severe COVID-19 and/or hospitalization.  2. The significant known and potential risks and benefits of COVID monoclonal antibody, and the extent to which such potential risks and benefits are unknown.  3. Information on available alternative treatments and the risks and benefits of those alternatives, including clinical trials.  4. Patients treated with COVID monoclonal antibody should continue to self-isolate and use infection control measures (e.g., wear mask, isolate, social distance, avoid sharing personal items, clean and disinfect "high touch" surfaces, and frequent handwashing) according to CDC guidelines.   5. The patient or parent/caregiver has the option to accept or refuse COVID monoclonal antibody treatment.  After reviewing this information with the patient, the patient has agreed to receive one of the  available covid 19 monoclonal antibodies and will be provided an appropriate fact sheet prior to infusion. [name], NP 10/15/2020 10:54 AM

## 2020-10-16 ENCOUNTER — Other Ambulatory Visit: Payer: Self-pay | Admitting: Infectious Diseases

## 2020-10-16 ENCOUNTER — Ambulatory Visit (HOSPITAL_COMMUNITY)
Admission: RE | Admit: 2020-10-16 | Discharge: 2020-10-16 | Disposition: A | Discharge status: Home / Self Care | Payer: No Typology Code available for payment source | Source: Ambulatory Visit | Attending: Pulmonary Disease | Admitting: Pulmonary Disease

## 2020-10-16 DIAGNOSIS — E860 Dehydration: Secondary | ICD-10-CM

## 2020-10-16 DIAGNOSIS — U071 COVID-19: Secondary | ICD-10-CM | POA: Insufficient documentation

## 2020-10-16 DIAGNOSIS — Z20822 Contact with and (suspected) exposure to covid-19: Secondary | ICD-10-CM

## 2020-10-16 DIAGNOSIS — Z6826 Body mass index (BMI) 26.0-26.9, adult: Secondary | ICD-10-CM | POA: Insufficient documentation

## 2020-10-16 MED ORDER — SODIUM CHLORIDE 0.9 % IV SOLN: Freq: Once | INTRAVENOUS | Status: AC

## 2020-10-16 MED ORDER — SODIUM CHLORIDE 0.9 % IV SOLN: INTRAVENOUS | Status: DC | PRN

## 2020-10-16 MED ORDER — METHYLPREDNISOLONE SODIUM SUCC 125 MG IJ SOLR: 125.0000 mg | Freq: Once | INTRAMUSCULAR | Status: DC | PRN

## 2020-10-16 MED ORDER — ONDANSETRON HCL 4 MG/2ML IJ SOLN
4.0000 mg | Freq: Once | INTRAMUSCULAR | Status: AC
  Administered 2020-10-16: 4 mg via INTRAVENOUS
  Filled 2020-10-16: qty 2

## 2020-10-16 MED ORDER — FAMOTIDINE IN NACL 20-0.9 MG/50ML-% IV SOLN
20.0000 mg | Freq: Once | INTRAVENOUS | Status: AC | PRN
  Administered 2020-10-16: 20 mg via INTRAVENOUS
  Filled 2020-10-16: qty 50

## 2020-10-16 MED ORDER — EPINEPHRINE 0.3 MG/0.3ML IJ SOAJ: 0.3000 mg | Freq: Once | INTRAMUSCULAR | Status: DC | PRN

## 2020-10-16 MED ORDER — ALBUTEROL SULFATE HFA 108 (90 BASE) MCG/ACT IN AERS: 2.0000 | INHALATION_SPRAY | Freq: Once | RESPIRATORY_TRACT | Status: DC | PRN

## 2020-10-16 MED ORDER — DIPHENHYDRAMINE HCL 50 MG/ML IJ SOLN: 50.0000 mg | Freq: Once | INTRAMUSCULAR | Status: DC | PRN

## 2020-10-16 MED FILL — ALBUTEROL SULFATE HFA 108 (: 108 (90 BAS | 25 days supply | Qty: 9 | Fill #1

## 2020-10-16 NOTE — Progress Notes (Signed)
  Diagnosis: COVID-19  Physician:Dr WRight  Procedure: [facility] Infusion Clinic Med: bamlanivimab\etesevimab infusion - Provided patient with bamlanimivab\etesevimab fact sheet for patients, parents and caregivers prior to infusion.  Complications: No immediate complications noted.  Discharge: Discharged home   [place], [place] 10/16/2020

## 2020-10-16 NOTE — Discharge Instructions (Signed)

## 2020-10-25 MED FILL — AMPHETAMINE SALTS 10 MG: 10 | 30 days supply | Qty: 30 | Fill #0

## 2020-11-01 ENCOUNTER — Other Ambulatory Visit (HOSPITAL_COMMUNITY): Payer: Self-pay | Admitting: Internal Medicine

## 2020-11-01 MED FILL — VYVANSE 30 MG CAPSULE: 30 | 30 days supply | Qty: 30 | Fill #0

## 2020-11-02 MED FILL — CABERGOLINE 0.5 MG TABS: 0.5 | 84 days supply | Qty: 12 | Fill #0

## 2020-11-07 ENCOUNTER — Other Ambulatory Visit (HOSPITAL_COMMUNITY): Payer: Self-pay | Admitting: Psychiatry

## 2020-11-09 MED FILL — SERTRALINE HCL 100 MG TABS: 100 | 90 days supply | Qty: 135 | Fill #0

## 2020-11-15 MED FILL — CABERGOLINE 0.5 MG TABS: 0.5 | 84 days supply | Qty: 12 | Fill #0

## 2020-11-29 MED FILL — VYVANSE 30 MG CAPSULE: 30 | 30 days supply | Qty: 30 | Fill #0

## 2020-12-21 MED FILL — SERTRALINE HCL 100 MG TABS: 100 | 90 days supply | Qty: 90 | Fill #0

## 2021-01-06 MED FILL — VYVANSE 30 MG CAPSULE: 30 | 30 days supply | Qty: 30 | Fill #0

## 2021-01-06 MED FILL — AMPHETAMINE SALTS 10 MG: 10 | 30 days supply | Qty: 30 | Fill #0

## 2021-02-01 MED FILL — VYVANSE 30 MG CAPSULE: 30 | 30 days supply | Qty: 30 | Fill #0

## 2021-02-03 MED FILL — AMPHETAMINE SALTS 10 MG: 10 | 30 days supply | Qty: 30 | Fill #0

## 2021-02-05 ENCOUNTER — Other Ambulatory Visit (HOSPITAL_COMMUNITY): Payer: Self-pay | Admitting: Psychiatry

## 2021-02-06 ENCOUNTER — Other Ambulatory Visit (HOSPITAL_COMMUNITY): Payer: Self-pay | Admitting: Psychiatry

## 2021-03-08 MED FILL — AMPHETAMINE SALTS 10 MG: 10 | 90 days supply | Qty: 90 | Fill #0

## 2021-03-08 MED FILL — VYVANSE 30 MG CAPSULE: 30 | 30 days supply | Qty: 30 | Fill #0

## 2021-03-08 MED FILL — CABERGOLINE 0.5 MG TABS: 0.5 | 84 days supply | Qty: 12 | Fill #1

## 2021-04-12 ENCOUNTER — Other Ambulatory Visit (HOSPITAL_COMMUNITY): Payer: Self-pay

## 2021-04-12 MED FILL — Lisdexamfetamine Dimesylate Cap 30 MG: ORAL | 90 days supply | Qty: 90 | Fill #0 | Status: AC

## 2021-04-13 ENCOUNTER — Other Ambulatory Visit (HOSPITAL_COMMUNITY): Payer: Self-pay

## 2021-05-08 ENCOUNTER — Other Ambulatory Visit (HOSPITAL_COMMUNITY): Payer: Self-pay

## 2021-05-08 MED ORDER — VYVANSE 30 MG PO CAPS
ORAL_CAPSULE | Freq: Every day | ORAL | 0 refills | Status: DC
Start: 2021-05-08 — End: 2022-04-18

## 2021-05-08 MED ORDER — AMPHETAMINE-DEXTROAMPHETAMINE 10 MG PO TABS
ORAL_TABLET | ORAL | 0 refills | Status: DC
Start: 2021-05-08 — End: 2022-04-18

## 2021-05-08 MED ORDER — SERTRALINE HCL 100 MG PO TABS
ORAL_TABLET | ORAL | 1 refills | Status: DC
Start: 2021-05-08 — End: 2022-04-18
  Filled 2021-05-08: qty 135, 90d supply, fill #0

## 2021-05-08 MED ORDER — VYVANSE 30 MG PO CAPS
ORAL_CAPSULE | Freq: Every day | ORAL | 0 refills | Status: DC
  Filled 2021-07-23: qty 90, 90d supply, fill #0

## 2021-05-21 ENCOUNTER — Other Ambulatory Visit (HOSPITAL_COMMUNITY): Payer: Self-pay

## 2021-05-21 MED FILL — Cabergoline Tab 0.5 MG: ORAL | 84 days supply | Qty: 12 | Fill #0 | Status: AC

## 2021-05-22 ENCOUNTER — Other Ambulatory Visit (HOSPITAL_COMMUNITY): Payer: Self-pay

## 2021-06-04 ENCOUNTER — Encounter: Payer: Self-pay | Admitting: Internal Medicine

## 2021-07-03 ENCOUNTER — Encounter: Payer: Self-pay | Admitting: Internal Medicine

## 2021-07-10 ENCOUNTER — Other Ambulatory Visit (HOSPITAL_COMMUNITY): Payer: Self-pay

## 2021-07-10 ENCOUNTER — Other Ambulatory Visit: Payer: Self-pay | Admitting: Hematology and Oncology

## 2021-07-10 MED ORDER — CARESTART COVID-19 HOME TEST VI KIT
PACK | 0 refills | Status: DC
Start: 2021-07-10 — End: 2021-07-25
  Filled 2021-07-10: qty 4, 4d supply, fill #0

## 2021-07-10 MED ORDER — MOLNUPIRAVIR EUA 200MG CAPSULE
4.0000 | ORAL_CAPSULE | Freq: Two times a day (BID) | ORAL | 0 refills | Status: AC
  Filled 2021-07-10: qty 40, 5d supply, fill #0

## 2021-07-10 NOTE — Progress Notes (Signed)
Diagnosis of COVID-19 infection with mild to moderate symptoms Sent a prescription for Molnupiravir 200 mg capsules 4 tablets p.o. twice daily for 5 days. Sent a prescription to [facility].

## 2021-07-17 ENCOUNTER — Encounter: Payer: No Typology Code available for payment source | Admitting: Internal Medicine

## 2021-07-21 ENCOUNTER — Encounter: Payer: Self-pay | Admitting: Internal Medicine

## 2021-07-23 ENCOUNTER — Other Ambulatory Visit (HOSPITAL_COMMUNITY): Payer: Self-pay

## 2021-07-23 NOTE — Telephone Encounter (Signed)
Prolactin was already added 08/2020. Okay to add TB test?

## 2021-07-24 ENCOUNTER — Other Ambulatory Visit: Payer: Self-pay

## 2021-07-24 ENCOUNTER — Other Ambulatory Visit (INDEPENDENT_AMBULATORY_CARE_PROVIDER_SITE_OTHER): Payer: No Typology Code available for payment source

## 2021-07-24 DIAGNOSIS — Z1389 Encounter for screening for other disorder: Secondary | ICD-10-CM

## 2021-07-24 DIAGNOSIS — Z13220 Encounter for screening for lipoid disorders: Secondary | ICD-10-CM | POA: Diagnosis not present

## 2021-07-24 DIAGNOSIS — Z1329 Encounter for screening for other suspected endocrine disorder: Secondary | ICD-10-CM

## 2021-07-24 DIAGNOSIS — E221 Hyperprolactinemia: Secondary | ICD-10-CM

## 2021-07-24 DIAGNOSIS — Z0000 Encounter for general adult medical examination without abnormal findings: Secondary | ICD-10-CM | POA: Diagnosis not present

## 2021-07-24 DIAGNOSIS — D369 Benign neoplasm, unspecified site: Secondary | ICD-10-CM | POA: Diagnosis not present

## 2021-07-24 LAB — CBC WITH DIFFERENTIAL/PLATELET
Basophils Absolute: 0.2 10*3/uL — ABNORMAL HIGH (ref 0.0–0.1)
Basophils Relative: 2.8 % (ref 0.0–3.0)
Eosinophils Absolute: 0.3 10*3/uL (ref 0.0–0.7)
Eosinophils Relative: 4.2 % (ref 0.0–5.0)
HCT: 39.1 % (ref 36.0–46.0)
Hemoglobin: 13.2 g/dL (ref 12.0–15.0)
Lymphocytes Relative: 25 % (ref 12.0–46.0)
Lymphs Abs: 1.6 10*3/uL (ref 0.7–4.0)
MCHC: 33.8 g/dL (ref 30.0–36.0)
MCV: 91.6 fl (ref 78.0–100.0)
Monocytes Absolute: 0.5 10*3/uL (ref 0.1–1.0)
Monocytes Relative: 7.9 % (ref 3.0–12.0)
Neutro Abs: 3.8 10*3/uL (ref 1.4–7.7)
Neutrophils Relative %: 60.1 % (ref 43.0–77.0)
Platelets: 325 10*3/uL (ref 150.0–400.0)
RBC: 4.27 Mil/uL (ref 3.87–5.11)
RDW: 13.3 % (ref 11.5–15.5)
WBC: 6.3 10*3/uL (ref 4.0–10.5)

## 2021-07-24 LAB — LIPID PANEL
Cholesterol: 143 mg/dL (ref 0–200)
HDL: 44.7 mg/dL (ref >39.00)
LDL Cholesterol: 87 mg/dL (ref 0–99)
NonHDL: 98.77
Total CHOL/HDL Ratio: 3
Triglycerides: 59 mg/dL (ref 0.0–149.0)
VLDL: 11.8 mg/dL (ref 0.0–40.0)

## 2021-07-24 LAB — COMPREHENSIVE METABOLIC PANEL
ALT: 12 U/L (ref 0–35)
AST: 17 U/L (ref 0–37)
Albumin: 4.2 g/dL (ref 3.5–5.2)
Alkaline Phosphatase: 46 U/L (ref 39–117)
BUN: 15 mg/dL (ref 6–23)
CO2: 26 mEq/L (ref 19–32)
Calcium: 9.1 mg/dL (ref 8.4–10.5)
Chloride: 102 mEq/L (ref 96–112)
Creatinine, Ser: 0.8 mg/dL (ref 0.40–1.20)
GFR: 93.8 mL/min (ref >60.00)
Glucose, Bld: 93 mg/dL (ref 70–99)
Potassium: 3.9 mEq/L (ref 3.5–5.1)
Sodium: 137 mEq/L (ref 135–145)
Total Bilirubin: 0.5 mg/dL (ref 0.2–1.2)
Total Protein: 7 g/dL (ref 6.0–8.3)

## 2021-07-24 LAB — TSH: TSH: 1.41 u[IU]/mL (ref 0.35–5.50)

## 2021-07-24 NOTE — Addendum Note (Signed)
Addended by: Leeanne Rio on: 07/24/2021 08:20 AM   Modules accepted: Orders

## 2021-07-24 NOTE — Addendum Note (Signed)
Addended by: Orland Mustard on: 07/24/2021 08:25 AM   Modules accepted: Orders

## 2021-07-25 ENCOUNTER — Encounter: Payer: Self-pay | Admitting: Internal Medicine

## 2021-07-25 ENCOUNTER — Other Ambulatory Visit (HOSPITAL_COMMUNITY): Payer: Self-pay

## 2021-07-25 ENCOUNTER — Ambulatory Visit (INDEPENDENT_AMBULATORY_CARE_PROVIDER_SITE_OTHER): Payer: No Typology Code available for payment source | Admitting: Internal Medicine

## 2021-07-25 ENCOUNTER — Other Ambulatory Visit: Payer: Self-pay

## 2021-07-25 VITALS — BP 116/74 | HR 83 | Temp 97.0°F | Ht 65.98 in | Wt 172.6 lb

## 2021-07-25 DIAGNOSIS — D369 Benign neoplasm, unspecified site: Secondary | ICD-10-CM

## 2021-07-25 DIAGNOSIS — J9801 Acute bronchospasm: Secondary | ICD-10-CM

## 2021-07-25 DIAGNOSIS — Z1389 Encounter for screening for other disorder: Secondary | ICD-10-CM

## 2021-07-25 DIAGNOSIS — Z111 Encounter for screening for respiratory tuberculosis: Secondary | ICD-10-CM | POA: Diagnosis not present

## 2021-07-25 DIAGNOSIS — J309 Allergic rhinitis, unspecified: Secondary | ICD-10-CM

## 2021-07-25 DIAGNOSIS — Z0000 Encounter for general adult medical examination without abnormal findings: Secondary | ICD-10-CM

## 2021-07-25 DIAGNOSIS — R7989 Other specified abnormal findings of blood chemistry: Secondary | ICD-10-CM

## 2021-07-25 LAB — PROLACTIN: Prolactin: 1.6 ng/mL — ABNORMAL LOW

## 2021-07-25 MED ORDER — ALBUTEROL SULFATE HFA 108 (90 BASE) MCG/ACT IN AERS
1.0000 | INHALATION_SPRAY | Freq: Four times a day (QID) | RESPIRATORY_TRACT | 12 refills | Status: DC | PRN
  Filled 2021-07-25: qty 18, 25d supply, fill #0

## 2021-07-25 MED ORDER — FLUTICASONE PROPIONATE 50 MCG/ACT NA SUSP
2.0000 | Freq: Every day | NASAL | 11 refills | Status: DC
  Filled 2021-07-25: qty 16, 30d supply, fill #0

## 2021-07-25 MED ORDER — CETIRIZINE HCL 10 MG PO TABS
10.0000 mg | ORAL_TABLET | Freq: Every day | ORAL | 3 refills | Status: DC | PRN
  Filled 2021-07-25: qty 90, 90d supply, fill #0

## 2021-07-25 NOTE — Progress Notes (Signed)
Chief Complaint  Patient presents with   Annual Exam   Annual  1. 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 Buddy Duty endocrine 07/31/21  Results for Suzanne Evans, Suzanne Evans (MRN [id number]) as of 07/25/2021 16:50  08/23/2019 08:02 Prolactin: 36.9 (H)  07/24/2021 08:11 Prolactin: 1.6 (L)  2. Mood/anxiety/adhd controlled on zoloft, adderall IR 10 mg and vyvanse 30 in school doctorate of NP at [employer] has 2 more semesters doing well 3. Needs quantiferon gold for school will do today  4. Cycle started today will f/u ob/gyn upcoming   Review of Systems  Constitutional:  Negative for weight loss.  HENT:  Negative for hearing loss.   Eyes:  Negative for blurred vision.  Respiratory:  Negative for shortness of breath.   Cardiovascular:  Negative for chest pain.  Gastrointestinal:  Negative for abdominal pain.  Musculoskeletal:  Negative for falls and joint pain.  Skin:  Negative for rash.  Neurological:  Negative for headaches.  Psychiatric/Behavioral:  Negative for depression. The patient is not nervous/anxious.   Past Medical History:  Diagnosis Date   Abnormal Pap smear of cervix    ASCUS, +HPV   Anxiety    Bunion, left foot    Chicken pox    Constipation    Constipation    COVID-19    06/2021   Genital herpes    Microadenoma    pituitary Dr. Buddy Duty   Ovarian cyst    left s/p rupture imaing 2020/2021   PTSD (post-traumatic stress disorder)    previous marital issues (ex husband)   Past Surgical History:  Procedure Laterality Date   APPENDECTOMY  2003   TUBAL LIGATION N/A 06/28/2016   Procedure: POST PARTUM TUBAL LIGATION;  Surgeon: Boykin Nearing, MD;  Location: [facility] ORS;  Service: Gynecology;  Laterality: N/A;   Family History  Problem Relation Age of Onset   Heart disease Maternal Grandmother    Hyperlipidemia Maternal Grandmother    Stroke Maternal Grandmother    Hyperlipidemia Mother    Obesity Mother    Alcohol abuse Paternal Grandfather    Obesity Father     Obesity Sister    Crohn's disease Sister        s/p colectomy age 44 y.o   Other Sister        covid 67   Obesity Brother    Lung cancer Maternal Grandfather        smoker   Colon cancer Neg Hx    Colitis Neg Hx    Diabetes Neg Hx    Kidney disease Neg Hx    Liver disease Neg Hx    Social History   Socioeconomic History   Marital status: Married    Spouse name: Not on file   Number of children: 2   Years of education: 18   Highest education level: Not on file  Occupational History   Occupation: Designer, jewellery    Comment: [employer] Use   Smoking status: Former    Packs/day: 1.00    Years: 8.00    Pack years: 8.00    Types: Cigarettes    Quit date: 12/30/2004    Years since quitting: 16.5   Smokeless tobacco: Never   Tobacco comments:    in college only  Substance and Sexual Activity   Alcohol use: Yes    Alcohol/week: 2.0 standard drinks    Types: 2 Standard drinks or equivalent per week    Comment: 2 or less drinks weekly  Drug use: No   Sexual activity: Yes    Partners: Male    Birth control/protection: Surgical  Other Topics Concern   Not on file  Social History Narrative   [place] grew up in [place]. 3 kids and 1 step son. Fionnuala is currently divorced and re-married. She attended [employer] for her Bachelors in Nursing. She then attended [employer] for her Masters in Adult Oncology Nurse Practitioner. She is working at [employer]. She spends all of her spare time with her children husband and dog      Caffeine - 2 cups of coffee daily then water   Exercise - active   APP of year 2021 won award    Social Determinants of Radio broadcast assistant Strain: Not on file  Food Insecurity: Not on file  Transportation Needs: Not on file  Physical Activity: Not on file  Stress: Not on file  Social Connections: Not on file  Intimate Partner Violence: Not on file   Current Meds  Medication Sig    amphetamine-dextroamphetamine (ADDERALL) 10 MG tablet Take 1 tablet (10 mg total) by mouth daily at 6 (six) AM. Before 3 pm Dr. Nicolasa Ducking   amphetamine-dextroamphetamine (ADDERALL) 10 MG tablet TAKE 1 TABLET BY MOUTH DAILY AT 3PM   amphetamine-dextroamphetamine (ADDERALL) 10 MG tablet TAKE 1 TABLET BY MOUTH DAILY AT 3PM   amphetamine-dextroamphetamine (ADDERALL) 10 MG tablet Take 1 tablet by mouth daily at 3pm   azelastine (ASTELIN) 0.1 % nasal spray Place 1-2 sprays into both nostrils 2 (two) times daily. Use in each nostril as directed   cabergoline (DOSTINEX) 0.5 MG tablet Take 0.25 mg by mouth 2 (two) times a week.   cabergoline (DOSTINEX) 0.5 MG tablet TAKE 1/2 TABLET BY MOUTH TWICE A WEEK   Cetirizine HCl (ZYRTEC ALLERGY PO) Take by mouth.   Chlorpheniramine-Phenylephrine (SUDAFED PE SINUS/ALLERGY PO) Take by mouth.   Cholecalciferol (VITAMIN D3) 3000 units TABS Take by mouth daily.   clobetasol (OLUX) 0.05 % topical foam Apply topically 2 (two) times daily. Prn   Folic Acid (FOLATE PO) Take by mouth.   ketoconazole (NIZORAL) 2 % shampoo Apply 1 application topically 2 (two) times a week. Let  Seat on x 5 + minutes   lisdexamfetamine (VYVANSE) 30 MG capsule Take 1 capsule by mouth daily   lisdexamfetamine (VYVANSE) 30 MG capsule Take 1capsule by mouth daily   Magnesium 400 MG CAPS Take by mouth daily.   Multiple Vitamins-Minerals (ZINC PO) Take by mouth.   sertraline (ZOLOFT) 100 MG tablet Take 100 mg by mouth daily.   sertraline (ZOLOFT) 100 MG tablet TAKE 1 AND 1/2 TABLETS BY MOUTH DAILY WITH BREAKFAST   sertraline (ZOLOFT) 100 MG tablet TAKE 1 AND 1/2 TABLET BY MOUTH DAILY WITH BREAKFAST   sertraline (ZOLOFT) 100 MG tablet Take 1 and 1/2 tablet by mouth daily with breakfast   sodium chloride (OCEAN) 0.65 % SOLN nasal spray Place 1-2 sprays into both nostrils daily.   vitamin B-12 (CYANOCOBALAMIN) 1000 MCG tablet Take 1,000 mcg by mouth daily.   VYVANSE 30 MG capsule    [DISCONTINUED]  albuterol (VENTOLIN HFA) 108 (90 Base) MCG/ACT inhaler Inhale 1-2 puffs into the lungs every 6 (six) hours as needed for wheezing or shortness of breath.   [DISCONTINUED] fluticasone (FLONASE) 50 MCG/ACT nasal spray Place 2 sprays into both nostrils daily.   Allergies  Allergen Reactions   Sulfa Antibiotics Anaphylaxis   Erythromycin Other (See Comments)    Unknown rxn,  childhood allergy   Iron Itching   Recent Results (from the past 2160 hour(s))  Prolactin     Status: Abnormal   Collection Time: 07/24/21  8:11 AM  Result Value Ref Range   Prolactin 1.6 (L) ng/mL    Comment:             Reference Range  Females         Non-pregnant        3.0-30.0         Pregnant           10.0-209.0         Postmenopausal      2.0-20.0 . . .   Lipid panel     Status: None   Collection Time: 07/24/21  8:11 AM  Result Value Ref Range   Cholesterol 143 0 - 200 mg/dL    Comment: ATP III Classification       Desirable:  < 200 mg/dL               Borderline High:  200 - 239 mg/dL          High:  > = 240 mg/dL   Triglycerides 59.0 0.0 - 149.0 mg/dL    Comment: Normal:  <150 mg/dLBorderline High:  150 - 199 mg/dL   HDL 44.70 >39.00 mg/dL   VLDL 11.8 0.0 - 40.0 mg/dL   LDL Cholesterol 87 0 - 99 mg/dL   Total CHOL/HDL Ratio 3     Comment:                Men          Women1/2 Average Risk     3.4          3.3Average Risk          5.0          4.42X Average Risk          9.6          7.13X Average Risk          15.0          11.0                       NonHDL 98.77     Comment: NOTE:  Non-HDL goal should be 30 mg/dL higher than patient's LDL goal (i.e. LDL goal of < 70 mg/dL, would have non-HDL goal of < 100 mg/dL)  CBC with Differential/Platelet     Status: Abnormal   Collection Time: 07/24/21  8:11 AM  Result Value Ref Range   WBC 6.3 4.0 - 10.5 K/uL   RBC 4.27 3.87 - 5.11 Mil/uL   Hemoglobin 13.2 12.0 - 15.0 g/dL   HCT 39.1 36.0 - 46.0 %   MCV 91.6 78.0 - 100.0 fl   MCHC 33.8 30.0 - 36.0 g/dL    RDW 13.3 11.5 - 15.5 %   Platelets 325.0 150.0 - 400.0 K/uL   Neutrophils Relative % 60.1 43.0 - 77.0 %   Lymphocytes Relative 25.0 12.0 - 46.0 %   Monocytes Relative 7.9 3.0 - 12.0 %   Eosinophils Relative 4.2 0.0 - 5.0 %   Basophils Relative 2.8 0.0 - 3.0 %   Neutro Abs 3.8 1.4 - 7.7 K/uL   Lymphs Abs 1.6 0.7 - 4.0 K/uL   Monocytes Absolute 0.5 0.1 - 1.0 K/uL   Eosinophils Absolute 0.3 0.0 - 0.7 K/uL   Basophils Absolute 0.2 (H) 0.0 -  0.1 K/uL  Comprehensive metabolic panel     Status: None   Collection Time: 07/24/21  8:11 AM  Result Value Ref Range   Sodium 137 135 - 145 mEq/L   Potassium 3.9 3.5 - 5.1 mEq/L   Chloride 102 96 - 112 mEq/L   CO2 26 19 - 32 mEq/L   Glucose, Bld 93 70 - 99 mg/dL   BUN 15 6 - 23 mg/dL   Creatinine, Ser 0.80 0.40 - 1.20 mg/dL   Total Bilirubin 0.5 0.2 - 1.2 mg/dL   Alkaline Phosphatase 46 39 - 117 U/L   AST 17 0 - 37 U/L   ALT 12 0 - 35 U/L   Total Protein 7.0 6.0 - 8.3 g/dL   Albumin 4.2 3.5 - 5.2 g/dL   GFR 93.80 >60.00 mL/min    Comment: Calculated using the CKD-EPI Creatinine Equation (2021)   Calcium 9.1 8.4 - 10.5 mg/dL  TSH     Status: None   Collection Time: 07/24/21  8:11 AM  Result Value Ref Range   TSH 1.41 0.35 - 5.50 uIU/mL   Objective  Body mass index is 27.87 kg/m. Wt Readings from Last 3 Encounters:  07/25/21 172 lb 9.6 oz (78.3 kg)  09/26/20 166 lb 12.8 oz (75.7 kg)  04/07/20 155 lb 12.8 oz (70.7 kg)   Temp Readings from Last 3 Encounters:  07/25/21 (!) 97 F (36.1 C) (Temporal)  10/16/20 98 F (36.7 C) (Oral)  09/26/20 98.5 F (36.9 C) (Oral)   BP Readings from Last 3 Encounters:  07/25/21 116/74  10/16/20 125/81  09/26/20 114/80   Pulse Readings from Last 3 Encounters:  07/25/21 83  10/16/20 72  09/26/20 79    Physical Exam Vitals and nursing note reviewed.  Constitutional:      Appearance: Normal appearance. She is well-developed and well-groomed.  HENT:     Head: Normocephalic and atraumatic.   Eyes:     Conjunctiva/sclera: Conjunctivae normal.     Pupils: Pupils are equal, round, and reactive to light.  Cardiovascular:     Rate and Rhythm: Normal rate and regular rhythm.     Heart sounds: Normal heart sounds. No murmur heard. Pulmonary:     Effort: Pulmonary effort is normal.     Breath sounds: Normal breath sounds.  Abdominal:     General: Abdomen is flat. Bowel sounds are normal.  Skin:    General: Skin is warm and dry.  Neurological:     General: No focal deficit present.     Mental Status: She is alert and oriented to person, place, and time. Mental status is at baseline.     Gait: Gait normal.  Psychiatric:        Attention and Perception: Attention and perception normal.        Mood and Affect: Mood and affect normal.        Speech: Speech normal.        Behavior: Behavior normal. Behavior is cooperative.        Thought Content: Thought content normal.        Cognition and Memory: Cognition and memory normal.        Judgment: Judgment normal.    Assessment  Plan  Annual physical exam HM UTD flu 08/2020 Tdap utd 04/04/16 covid 3/3 2nd dose LAD, fatigue, fevers, chills had covid 06/2021+ consider 4th dose in future  MMR immune 11/04/00 Hep B immune 08/26/05 TB neg   OB/GYN [facility] now Dr. Murrell Redden 05/2020  appt 08/23/21   Hep B immune and had MMR vaccine    A1C 4.9 today 08/26/19    Mammogram [facility] 09/26/18 negative repeat age 42 y.o    Colonoscopy age 6 y.o    H/o neg Hep C, HIV   continue exercise and healthy diet   Screening-pulmonary TB - Plan: QuantiFERON-TB Gold Plus  Bronchospasm - Plan: albuterol (VENTOLIN HFA) 108 (90 Base) MCG/ACT inhaler Allergic rhinitis, unspecified seasonality, unspecified trigger - Plan: fluticasone (FLONASE) 50 MCG/ACT nasal spray  Microadenoma -prolactin improved f/u Dr. Buddy Duty 07/31/21 cont meds per endocrine  Provider: Dr. Olivia Mackie McLean-Scocuzza-Internal Medicine

## 2021-07-28 LAB — QUANTIFERON-TB GOLD PLUS
Mitogen-NIL: 1.98 IU/mL
NIL: 0.02 IU/mL
QuantiFERON-TB Gold Plus: NEGATIVE
TB1-NIL: 0 IU/mL
TB2-NIL: 0 IU/mL

## 2021-07-28 LAB — PROLACTIN

## 2021-07-28 LAB — URINALYSIS, ROUTINE W REFLEX MICROSCOPIC

## 2021-07-30 NOTE — Progress Notes (Signed)
Left message to return call 

## 2021-07-31 ENCOUNTER — Other Ambulatory Visit (HOSPITAL_COMMUNITY): Payer: Self-pay

## 2021-07-31 MED ORDER — CABERGOLINE 0.5 MG PO TABS
ORAL_TABLET | ORAL | 4 refills | Status: DC
Start: 2021-07-31 — End: 2022-04-18
  Filled 2021-07-31: qty 13, 90d supply, fill #0

## 2021-08-01 ENCOUNTER — Other Ambulatory Visit: Payer: Self-pay | Admitting: Internal Medicine

## 2021-08-01 DIAGNOSIS — D352 Benign neoplasm of pituitary gland: Secondary | ICD-10-CM

## 2021-08-08 ENCOUNTER — Other Ambulatory Visit (HOSPITAL_COMMUNITY): Payer: Self-pay

## 2021-08-10 ENCOUNTER — Other Ambulatory Visit (HOSPITAL_COMMUNITY): Payer: Self-pay

## 2021-08-13 ENCOUNTER — Other Ambulatory Visit (HOSPITAL_COMMUNITY): Payer: Self-pay | Admitting: Internal Medicine

## 2021-08-13 DIAGNOSIS — D352 Benign neoplasm of pituitary gland: Secondary | ICD-10-CM

## 2021-08-14 ENCOUNTER — Other Ambulatory Visit (HOSPITAL_COMMUNITY): Payer: Self-pay

## 2021-08-14 MED ORDER — SERTRALINE HCL 100 MG PO TABS
ORAL_TABLET | ORAL | 1 refills | Status: DC
  Filled 2021-08-14 – 2021-08-22 (×2): qty 135, 90d supply, fill #0

## 2021-08-14 MED ORDER — AMPHETAMINE-DEXTROAMPHETAMINE 10 MG PO TABS
ORAL_TABLET | ORAL | 0 refills | Status: DC
  Filled 2021-08-14 – 2021-08-22 (×2): qty 90, 90d supply, fill #0

## 2021-08-21 ENCOUNTER — Encounter (HOSPITAL_COMMUNITY): Payer: Self-pay

## 2021-08-21 ENCOUNTER — Ambulatory Visit (HOSPITAL_COMMUNITY): Payer: No Typology Code available for payment source

## 2021-08-22 ENCOUNTER — Other Ambulatory Visit (HOSPITAL_COMMUNITY): Payer: Self-pay

## 2021-08-24 ENCOUNTER — Ambulatory Visit (HOSPITAL_COMMUNITY)
Admission: RE | Admit: 2021-08-24 | Discharge: 2021-08-24 | Disposition: A | Discharge status: Home / Self Care | Payer: No Typology Code available for payment source | Source: Ambulatory Visit | Attending: Internal Medicine | Admitting: Internal Medicine

## 2021-08-24 ENCOUNTER — Other Ambulatory Visit: Payer: Self-pay

## 2021-08-24 DIAGNOSIS — D352 Benign neoplasm of pituitary gland: Secondary | ICD-10-CM | POA: Insufficient documentation

## 2021-08-24 MED ORDER — GADOBUTROL 1 MMOL/ML IV SOLN
7.5000 mL | Freq: Once | INTRAVENOUS | Status: AC | PRN
  Administered 2021-08-24: 7.5 mL via INTRAVENOUS

## 2021-08-28 ENCOUNTER — Other Ambulatory Visit (HOSPITAL_COMMUNITY): Payer: Self-pay

## 2021-10-22 ENCOUNTER — Other Ambulatory Visit (HOSPITAL_COMMUNITY): Payer: Self-pay

## 2021-10-22 MED ORDER — VYVANSE 40 MG PO CAPS
40.0000 mg | ORAL_CAPSULE | Freq: Every day | ORAL | 0 refills | Status: DC
  Filled 2021-10-22: qty 30, 30d supply, fill #0

## 2021-11-12 ENCOUNTER — Other Ambulatory Visit (HOSPITAL_COMMUNITY): Payer: Self-pay

## 2021-11-12 MED ORDER — AMPHETAMINE-DEXTROAMPHETAMINE 10 MG PO TABS
ORAL_TABLET | ORAL | 0 refills | Status: DC
  Filled 2021-12-26: qty 90, 90d supply, fill #0

## 2021-11-12 MED ORDER — VYVANSE 40 MG PO CAPS
ORAL_CAPSULE | Freq: Every day | ORAL | 0 refills | Status: DC
  Filled 2021-12-26: qty 90, 90d supply, fill #0

## 2021-11-12 MED ORDER — SERTRALINE HCL 100 MG PO TABS: ORAL_TABLET | ORAL | 0 refills | Status: DC

## 2021-11-12 MED ORDER — VYVANSE 40 MG PO CAPS
ORAL_CAPSULE | Freq: Every day | ORAL | 0 refills | Status: DC
  Filled 2021-11-23: qty 30, 30d supply, fill #0

## 2021-11-23 ENCOUNTER — Other Ambulatory Visit (HOSPITAL_COMMUNITY): Payer: Self-pay

## 2021-12-21 ENCOUNTER — Other Ambulatory Visit (HOSPITAL_COMMUNITY): Payer: Self-pay

## 2021-12-21 MED ORDER — CARESTART COVID-19 HOME TEST VI KIT
PACK | 0 refills | Status: DC
  Filled 2021-12-21: qty 4, 4d supply, fill #0

## 2021-12-21 MED ORDER — CABERGOLINE 0.5 MG PO TABS
ORAL_TABLET | ORAL | 11 refills | Status: DC
  Filled 2021-12-21: qty 6, 84d supply, fill #0

## 2021-12-26 ENCOUNTER — Other Ambulatory Visit (HOSPITAL_COMMUNITY): Payer: Self-pay

## 2022-02-07 ENCOUNTER — Other Ambulatory Visit (HOSPITAL_COMMUNITY): Payer: Self-pay

## 2022-02-07 MED ORDER — SERTRALINE HCL 100 MG PO TABS
ORAL_TABLET | ORAL | 0 refills | Status: DC
  Filled 2022-02-07 – 2022-02-26 (×2): qty 135, 90d supply, fill #0

## 2022-02-07 MED ORDER — AMPHETAMINE-DEXTROAMPHETAMINE 10 MG PO TABS: ORAL_TABLET | ORAL | 0 refills | Status: DC

## 2022-02-07 MED ORDER — VYVANSE 40 MG PO CAPS
ORAL_CAPSULE | Freq: Every day | ORAL | 0 refills | Status: DC
  Filled 2022-03-21: qty 90, 90d supply, fill #0

## 2022-02-15 ENCOUNTER — Other Ambulatory Visit (HOSPITAL_COMMUNITY): Payer: Self-pay

## 2022-02-26 ENCOUNTER — Other Ambulatory Visit (HOSPITAL_COMMUNITY): Payer: Self-pay

## 2022-03-08 ENCOUNTER — Other Ambulatory Visit (HOSPITAL_COMMUNITY): Payer: Self-pay

## 2022-03-08 ENCOUNTER — Other Ambulatory Visit: Payer: Self-pay

## 2022-03-08 MED ORDER — ONDANSETRON 8 MG PO TBDP
8.0000 mg | ORAL_TABLET | Freq: Three times a day (TID) | ORAL | 0 refills | Status: DC | PRN
  Filled 2022-03-08: qty 20, 7d supply, fill #0

## 2022-03-21 ENCOUNTER — Other Ambulatory Visit (HOSPITAL_COMMUNITY): Payer: Self-pay

## 2022-03-29 ENCOUNTER — Other Ambulatory Visit (HOSPITAL_COMMUNITY): Payer: Self-pay

## 2022-04-17 NOTE — Progress Notes (Signed)
? ? Suzanne Mccreedy D.Merril Abbe ?[facility] ?[place] ?Phone: [phone] ?  ?Assessment and Plan:   ?  ?1. Chronic left hip pain ?-Chronic with exacerbation, initial sports medicine visit ?- Likely labral pathology based on HPI, physical exam, unremarkable x-ray ?- Patient has trialed conservative therapy for the past 2 years without significant improvement which has included courses of NSAIDs, Tylenol, activity modification ?- Patient elects for CSI.  Tolerated well per note below ?- X-ray obtained in clinic.  My interpretation: No acute fracture or dislocation.  Overall unremarkable imaging ?- DG HIP UNILAT WITH PELVIS 2-3 VIEWS LEFT; Future  ? ?Procedure: Ultrasound Guided Hip Acetabulofemoral Joint Injection ?Side: Left ?Diagnosis: Chronic left hip pain ?[place] Indication:  ?- accuracy is paramount for diagnosis ?- to ensure therapeutic efficacy or procedural success ?- to reduce procedural risk ? ?After explaining the procedure, viable alternatives, risks, and answering any questions, consent was given verbally. The site was cleaned with chlorhexidine prep. An ultrasound transducer was placed on the anterior thigh/hip.   The acetabular joint, labrum, and femoral shaft were identified.  The neurovascular structures were identified and an approach was found specifically avoiding these structures.  A steroid injection was performed under ultrasound guidance with sterile technique using 29m of 1% lidocaine without epinephrine and 40 mg of triamcinolone (KENALOG) 485mml. This was well tolerated and resulted in  relief.  Needle was removed and dressing placed and post injection instructions were given including  a discussion of likely return of pain today after the anesthetic wears off (with the possibility of worsened pain) until the steroid starts to work in 1-3 days.   Pt was advised to call or return to clinic if these symptoms worsen or fail to improve as  anticipated. ? ? ?Pertinent previous records reviewed include none ?  ?Follow Up: 3 weeks for reevaluation.  If no improvement, worsening symptoms, or recurrence of symptoms, would proceed with left hip MRI with intra-articular contrast.  Patient would be interested in PRP injections if they are thought to be appropriate and beneficial ?  ?Subjective:   ?I, MoPincus Badderam serving as a scEducation administratoror [employer] ?Chief Complaint: left hip pain  ? ?HPI:  ?04/18/2022 ?Patient is a [age]ear old female complaining of left hip pain. Patient states she was doing a boot camp 1-2 years ago and felt pain in her anterior hip, has taken aleve , has given boot camps a rest , any time she walks more than 5000 time a day it bothers her , now she does kick boxing, it hurts to lay down butterfly kicks or anything that involves being on the ground and hip motions, does not get numbness or tingling as of recent took some prednisone and it went away, pain stays in the hip and doesn't radiate, jogging hurts, does get popping bilaterally not painful, it actually relieves some of the pain but not long term  ? ?Relevant Historical Information: None pertinent ? ?Additional pertinent review of systems negative. ? ? ?Current Outpatient Medications:  ?  albuterol (VENTOLIN HFA) 108 (90 Base) MCG/ACT inhaler, Inhale 1-2 puffs into the lungs every 6 (six) hours as needed for wheezing or shortness of breath., Disp: 18 g, Rfl: 12 ?  azelastine (ASTELIN) 0.1 % nasal spray, Place 1-2 sprays into both nostrils 2 (two) times daily. Use in each nostril as directed, Disp: 30 mL, Rfl: 12 ?  cetirizine (ZYRTEC ALLERGY) 10 MG tablet, Take 1  tablet (10 mg total) by mouth daily as needed., Disp: 90 tablet, Rfl: 3 ?  Chlorpheniramine-Phenylephrine (SUDAFED PE SINUS/ALLERGY PO), Take by mouth., Disp: , Rfl:  ?  clobetasol (OLUX) 0.05 % topical foam, Apply topically 2 (two) times daily. Prn, Disp: 100 g, Rfl: 11 ?  COVID-19 At Home Antigen Test  [facility] COVID-19 HOME TEST) KIT, Use as directed, Disp: 4 each, Rfl: 0 ?  fluticasone (FLONASE) 50 MCG/ACT nasal spray, Place 2 sprays into both nostrils daily., Disp: 16 g, Rfl: 11 ?  Folic Acid (FOLATE PO), Take by mouth., Disp: , Rfl:  ?  ketoconazole (NIZORAL) 2 % shampoo, Apply 1 application topically 2 (two) times a week. Let  Seat on x 5 + minutes, Disp: 120 mL, Rfl: 11 ?  sodium chloride (OCEAN) 0.65 % SOLN nasal spray, Place 1-2 sprays into both nostrils daily., Disp: 30 mL, Rfl: 12  ? ?Objective:   ?  ?Vitals:  ? 04/18/22 1540  ?BP: 100/80  ?Pulse: 89  ?SpO2: 98%  ?Weight: 182 lb (82.6 kg)  ?Height: _0  (1.651 m)  ?  ?  ?Body mass index is 30.29 kg/m?.  ?  ?Physical Exam:   ? ?General: awake, alert, and oriented no acute distress, nontoxic ?Skin: no suspicious lesions or rashes ?Neuro:sensation intact distally with no dificits, normal muscle tone, no atrophy, strength 5/5 in all tested lower ext groups ?Psych: normal mood and affect, speech clear ? ?Left hip: ?No deformity, swelling or wasting ?ROM Flexion 90 (causes pain), ext 30, IR 45, ER 45 ?NTTP over the hip flexors, greater troch, glute musculature, si joint, lumbar spine ?Negative log roll with FROM ?Positive FABER ?Positive FADIR ?Negative Piriformis test ?Negative trendelenberg ?Gait normal  ? ? ?Electronically signed by:  ?Suzanne Mccreedy D.Merril Abbe ?[facility] ?4:04 PM 04/18/22 ?

## 2022-04-18 ENCOUNTER — Ambulatory Visit: Payer: Self-pay

## 2022-04-18 ENCOUNTER — Ambulatory Visit (INDEPENDENT_AMBULATORY_CARE_PROVIDER_SITE_OTHER): Payer: No Typology Code available for payment source | Admitting: Sports Medicine

## 2022-04-18 ENCOUNTER — Ambulatory Visit (INDEPENDENT_AMBULATORY_CARE_PROVIDER_SITE_OTHER): Payer: No Typology Code available for payment source

## 2022-04-18 VITALS — BP 100/80 | HR 89 | Ht 65.0 in | Wt 182.0 lb

## 2022-04-18 DIAGNOSIS — M25552 Pain in left hip: Secondary | ICD-10-CM

## 2022-04-18 DIAGNOSIS — G8929 Other chronic pain: Secondary | ICD-10-CM

## 2022-04-18 NOTE — Patient Instructions (Addendum)
Good to see you 3 week follow up  

## 2022-05-01 ENCOUNTER — Other Ambulatory Visit (HOSPITAL_COMMUNITY): Payer: Self-pay

## 2022-05-01 MED ORDER — VYVANSE 40 MG PO CAPS
ORAL_CAPSULE | Freq: Every day | ORAL | 0 refills | Status: DC
  Filled 2022-07-18: qty 90, 90d supply, fill #0

## 2022-05-01 MED ORDER — SERTRALINE HCL 100 MG PO TABS
ORAL_TABLET | ORAL | 0 refills | Status: DC
  Filled 2022-07-30: qty 135, 90d supply, fill #0

## 2022-05-08 NOTE — Progress Notes (Signed)
? ? Suzanne Evans ?[facility] ?[place] ?Phone: [phone] ?  ?Assessment and Plan:   ?  ?1. Chronic left hip pain ?-Chronic with exacerbation, subsequent visit ?- High suspicion for labral pathology based off of HPI, physical exam, unremarkable x-ray, mild temporary benefit from intra-articular CSI ?- Due to continued pain, failure to improve with >6 weeks of conservative therapy, unremarkable x-ray imaging, we will proceed with left hip MRI with intra-articular contrast to further evaluate for potential labral tear ?- MR HIP LEFT W CONTRAST; Future  ?-Patient is interested in PRP injection.  Pamphlet and instructions on PRP provided at today's visit.  If patient's MRI is consistent with labral pathology, she would like to proceed with PRP intra-articular injection ? ?Pertinent previous records reviewed include none ?  ?Follow Up: 3 days after MRI to review results.  If results are consistent with labral pathology, we would proceed with PRP injection at that visit ?  ?Subjective:   ?I, Suzanne Evans, am serving as a Education administrator for [employer] ?  ?Chief Complaint: left hip pain  ?  ?HPI:  ?04/18/2022 ?Patient is a 41 year old female complaining of left hip pain. Patient states she was doing a boot camp 1-2 years ago and felt pain in her anterior hip, has taken aleve , has given boot camps a rest , any time she walks more than 5000 time a day it bothers her , now she does kick boxing, it hurts to lay down butterfly kicks or anything that involves being on the ground and hip motions, does not get numbness or tingling as of recent took some prednisone and it went away, pain stays in the hip and doesn't radiate, jogging hurts, does get popping bilaterally not painful, it actually relieves some of the pain but not long term ? ?05/09/2022 ?Patient states that hip is slightly improved went to [facility] and walked a lot  and was in pain , had COVID  and was resting so no pain then , feels more like a dull toothache now still constant  ? ?Relevant Historical Information: None pertinent ? ?Additional pertinent review of systems negative. ? ? ?Current Outpatient Medications:  ?  albuterol (VENTOLIN HFA) 108 (90 Base) MCG/ACT inhaler, Inhale 1-2 puffs into the lungs every 6 (six) hours as needed for wheezing or shortness of breath., Disp: 18 g, Rfl: 12 ?  azelastine (ASTELIN) 0.1 % nasal spray, Place 1-2 sprays into both nostrils 2 (two) times daily. Use in each nostril as directed, Disp: 30 mL, Rfl: 12 ?  cetirizine (ZYRTEC ALLERGY) 10 MG tablet, Take 1 tablet (10 mg total) by mouth daily as needed., Disp: 90 tablet, Rfl: 3 ?  Chlorpheniramine-Phenylephrine (SUDAFED PE SINUS/ALLERGY PO), Take by mouth., Disp: , Rfl:  ?  clobetasol (OLUX) 0.05 % topical foam, Apply topically 2 (two) times daily. Prn, Disp: 100 g, Rfl: 11 ?  COVID-19 At Home Antigen Test [facility] COVID-19 HOME TEST) KIT, Use as directed, Disp: 4 each, Rfl: 0 ?  fluticasone (FLONASE) 50 MCG/ACT nasal spray, Place 2 sprays into both nostrils daily., Disp: 16 g, Rfl: 11 ?  Folic Acid (FOLATE PO), Take by mouth., Disp: , Rfl:  ?  ketoconazole (NIZORAL) 2 % shampoo, Apply 1 application topically 2 (two) times a week. Let  Seat on x 5 + minutes, Disp: 120 mL, Rfl: 11 ?  lisdexamfetamine (VYVANSE) 40 MG capsule, Take 1 capsule by mouth daily, Disp: 90 capsule,  Rfl: 0 ?  sertraline (ZOLOFT) 100 MG tablet, Take 1 and 1/2 tablets by mouth daily with breakfast, Disp: 135 tablet, Rfl: 0 ?  sodium chloride (OCEAN) 0.65 % SOLN nasal spray, Place 1-2 sprays into both nostrils daily., Disp: 30 mL, Rfl: 12  ? ?Objective:   ?  ?Vitals:  ? 05/09/22 1541  ?BP: 110/80  ?Pulse: 89  ?SpO2: 99%  ?Weight: 182 lb (82.6 kg)  ?Height: 5' 5"  (1.651 m)  ?  ?  ?Body mass index is 30.29 kg/m?.  ?  ?Physical Exam:   ? ?General: awake, alert, and oriented no acute distress, nontoxic ?Skin: no suspicious lesions or  rashes ?Neuro:sensation intact distally with no dificits, normal muscle tone, no atrophy, strength 5/5 in all tested lower ext groups ?Psych: normal mood and affect, speech clear ?  ?Left hip: ?No deformity, swelling or wasting ?ROM Flexion 90 (causes pain), ext 30, IR 45, ER 45 ?NTTP over the hip flexors, greater troch, glute musculature, si joint, lumbar spine ?Negative log roll with FROM ?Positive FABER ?Positive FADIR ?Negative Piriformis test ?Negative trendelenberg ?Gait normal  ? ? ?Electronically signed by:  ?Suzanne Evans ?[facility] ?4:07 PM 05/09/22 ?

## 2022-05-09 ENCOUNTER — Ambulatory Visit: Payer: No Typology Code available for payment source | Admitting: Sports Medicine

## 2022-05-09 VITALS — BP 110/80 | HR 89 | Ht 65.0 in | Wt 182.0 lb

## 2022-05-09 DIAGNOSIS — M25552 Pain in left hip: Secondary | ICD-10-CM

## 2022-05-09 DIAGNOSIS — G8929 Other chronic pain: Secondary | ICD-10-CM | POA: Diagnosis not present

## 2022-05-09 NOTE — Addendum Note (Signed)
Addended by: Pincus Badder R on: 05/09/2022 04:13 PM ? ? Modules accepted: Orders ? ?

## 2022-05-09 NOTE — Addendum Note (Signed)
Addended by: Pincus Badder R on: 05/09/2022 04:19 PM ? ? Modules accepted: Orders ? ?

## 2022-05-09 NOTE — Patient Instructions (Addendum)
Good to see you  ?MRI referral left hip with contrast  ?Follow up 3 days after for a PRP injection  ? ?

## 2022-06-17 ENCOUNTER — Ambulatory Visit
Admission: RE | Admit: 2022-06-17 | Discharge: 2022-06-17 | Disposition: A | Discharge status: Home / Self Care | Payer: No Typology Code available for payment source | Source: Ambulatory Visit | Attending: Sports Medicine | Admitting: Sports Medicine

## 2022-06-17 DIAGNOSIS — G8929 Other chronic pain: Secondary | ICD-10-CM

## 2022-06-17 MED ORDER — IOPAMIDOL (ISOVUE-M 200) INJECTION 41%
15.0000 mL | Freq: Once | INTRAMUSCULAR | Status: AC
  Administered 2022-06-17: 15 mL via INTRA_ARTICULAR

## 2022-06-20 NOTE — Progress Notes (Unsigned)
Benito Mccreedy D.[place] Phone: [phone]   Assessment and Plan:     There are no diagnoses linked to this encounter.  ***   Pertinent previous records reviewed include ***   Follow Up: ***     Subjective:   I, Amanii Snethen, am serving as a Education administrator for Doctor Glennon Mac   Chief Complaint: left hip pain    HPI:  04/18/2022 Patient is a 41 year old female complaining of left hip pain. Patient states she was doing a boot camp 1-2 years ago and felt pain in her anterior hip, has taken aleve , has given boot camps a rest , any time she walks more than 5000 time a day it bothers her , now she does kick boxing, it hurts to lay down butterfly kicks or anything that involves being on the ground and hip motions, does not get numbness or tingling as of recent took some prednisone and it went away, pain stays in the hip and doesn't radiate, jogging hurts, does get popping bilaterally not painful, it actually relieves some of the pain but not long term   05/09/2022 Patient states that hip is slightly improved went to [facility] and walked a lot  and was in pain , had COVID and was resting so no pain then , feels more like a dull toothache now still constant   06/21/2022 Patient states   Additional pertinent review of systems negative.   Current Outpatient Medications:    albuterol (VENTOLIN HFA) 108 (90 Base) MCG/ACT inhaler, Inhale 1-2 puffs into the lungs every 6 (six) hours as needed for wheezing or shortness of breath., Disp: 18 g, Rfl: 12   azelastine (ASTELIN) 0.1 % nasal spray, Place 1-2 sprays into both nostrils 2 (two) times daily. Use in each nostril as directed, Disp: 30 mL, Rfl: 12   cetirizine (ZYRTEC ALLERGY) 10 MG tablet, Take 1 tablet (10 mg total) by mouth daily as needed., Disp: 90 tablet, Rfl: 3   Chlorpheniramine-Phenylephrine (SUDAFED PE SINUS/ALLERGY PO), Take by mouth., Disp: , Rfl:     clobetasol (OLUX) 0.05 % topical foam, Apply topically 2 (two) times daily. Prn, Disp: 100 g, Rfl: 11   COVID-19 At Home Antigen Test (CARESTART COVID-19 HOME TEST) KIT, Use as directed, Disp: 4 each, Rfl: 0   fluticasone (FLONASE) 50 MCG/ACT nasal spray, Place 2 sprays into both nostrils daily., Disp: 16 g, Rfl: 11   Folic Acid (FOLATE PO), Take by mouth., Disp: , Rfl:    ketoconazole (NIZORAL) 2 % shampoo, Apply 1 application topically 2 (two) times a week. Let  Seat on x 5 + minutes, Disp: 120 mL, Rfl: 11   lisdexamfetamine (VYVANSE) 40 MG capsule, Take 1 capsule by mouth daily, Disp: 90 capsule, Rfl: 0   sertraline (ZOLOFT) 100 MG tablet, Take 1 and 1/2 tablets by mouth daily with breakfast, Disp: 135 tablet, Rfl: 0   sodium chloride (OCEAN) 0.65 % SOLN nasal spray, Place 1-2 sprays into both nostrils daily., Disp: 30 mL, Rfl: 12   Objective:     There were no vitals filed for this visit.    There is no height or weight on file to calculate BMI.    Physical Exam:    ***   Electronically signed by:  Benito Mccreedy D.[facility] 2:33 PM 06/20/22

## 2022-06-21 ENCOUNTER — Ambulatory Visit (INDEPENDENT_AMBULATORY_CARE_PROVIDER_SITE_OTHER): Payer: Self-pay | Admitting: Sports Medicine

## 2022-06-21 ENCOUNTER — Ambulatory Visit: Payer: Self-pay

## 2022-06-21 VITALS — BP 118/82 | HR 87 | Ht 65.0 in | Wt 180.0 lb

## 2022-06-21 DIAGNOSIS — M25552 Pain in left hip: Secondary | ICD-10-CM

## 2022-06-21 DIAGNOSIS — G8929 Other chronic pain: Secondary | ICD-10-CM

## 2022-06-21 DIAGNOSIS — S73192D Other sprain of left hip, subsequent encounter: Secondary | ICD-10-CM

## 2022-07-10 NOTE — Progress Notes (Unsigned)
Suzanne Evans D.[place] Phone: [phone]   Assessment and Plan:     There are no diagnoses linked to this encounter.  ***   Pertinent previous records reviewed include ***   Follow Up: ***     Subjective:   I, Suzanne Evans, am serving as a Education administrator for Doctor Glennon Mac   Chief Complaint: left hip pain    HPI:  04/18/2022 Patient is a 41 year old female complaining of left hip pain. Patient states she was doing a boot camp 1-2 years ago and felt pain in her anterior hip, has taken aleve , has given boot camps a rest , any time she walks more than 5000 time a day it bothers her , now she does kick boxing, it hurts to lay down butterfly kicks or anything that involves being on the ground and hip motions, does not get numbness or tingling as of recent took some prednisone and it went away, pain stays in the hip and doesn't radiate, jogging hurts, does get popping bilaterally not painful, it actually relieves some of the pain but not long term   05/09/2022 Patient states that hip is slightly improved went to [facility] and walked a lot  and was in pain , had COVID and was resting so no pain then , feels more like a dull toothache now still constant    06/21/2022 Patient states that she is doing the same   07/11/2022 Patient states   Relevant Historical Information: ***  Additional pertinent review of systems negative.   Current Outpatient Medications:    albuterol (VENTOLIN HFA) 108 (90 Base) MCG/ACT inhaler, Inhale 1-2 puffs into the lungs every 6 (six) hours as needed for wheezing or shortness of breath., Disp: 18 g, Rfl: 12   azelastine (ASTELIN) 0.1 % nasal spray, Place 1-2 sprays into both nostrils 2 (two) times daily. Use in each nostril as directed, Disp: 30 mL, Rfl: 12   cetirizine (ZYRTEC ALLERGY) 10 MG tablet, Take 1 tablet (10 mg total) by mouth daily as needed., Disp: 90 tablet,  Rfl: 3   Chlorpheniramine-Phenylephrine (SUDAFED PE SINUS/ALLERGY PO), Take by mouth., Disp: , Rfl:    clobetasol (OLUX) 0.05 % topical foam, Apply topically 2 (two) times daily. Prn, Disp: 100 g, Rfl: 11   COVID-19 At Home Antigen Test (CARESTART COVID-19 HOME TEST) KIT, Use as directed, Disp: 4 each, Rfl: 0   fluticasone (FLONASE) 50 MCG/ACT nasal spray, Place 2 sprays into both nostrils daily., Disp: 16 g, Rfl: 11   Folic Acid (FOLATE PO), Take by mouth., Disp: , Rfl:    ketoconazole (NIZORAL) 2 % shampoo, Apply 1 application topically 2 (two) times a week. Let  Seat on x 5 + minutes, Disp: 120 mL, Rfl: 11   lisdexamfetamine (VYVANSE) 40 MG capsule, Take 1 capsule by mouth daily, Disp: 90 capsule, Rfl: 0   sertraline (ZOLOFT) 100 MG tablet, Take 1 and 1/2 tablets by mouth daily with breakfast, Disp: 135 tablet, Rfl: 0   sodium chloride (OCEAN) 0.65 % SOLN nasal spray, Place 1-2 sprays into both nostrils daily., Disp: 30 mL, Rfl: 12   Objective:     There were no vitals filed for this visit.    There is no height or weight on file to calculate BMI.    Physical Exam:    ***   Electronically signed by:  Suzanne Evans D.[facility] 2:06 PM  07/10/22 

## 2022-07-11 ENCOUNTER — Ambulatory Visit: Payer: No Typology Code available for payment source | Admitting: Sports Medicine

## 2022-07-11 VITALS — BP 110/76 | HR 92 | Ht 65.0 in | Wt 182.0 lb

## 2022-07-11 DIAGNOSIS — M25552 Pain in left hip: Secondary | ICD-10-CM | POA: Diagnosis not present

## 2022-07-11 DIAGNOSIS — G8929 Other chronic pain: Secondary | ICD-10-CM

## 2022-07-11 DIAGNOSIS — S73192D Other sprain of left hip, subsequent encounter: Secondary | ICD-10-CM | POA: Diagnosis not present

## 2022-07-18 ENCOUNTER — Other Ambulatory Visit (HOSPITAL_COMMUNITY): Payer: Self-pay

## 2022-07-19 ENCOUNTER — Other Ambulatory Visit (HOSPITAL_COMMUNITY): Payer: Self-pay

## 2022-07-29 IMAGING — DX DG HIP (WITH OR WITHOUT PELVIS) 2-3V*L*
3 series · 3 of 3 positions shown · non-contrast
Comparison: None.

CLINICAL DATA: Left hip

EXAM:
DG HIP (WITH OR WITHOUT PELVIS) 2-3V LEFT

[pelvis ap]
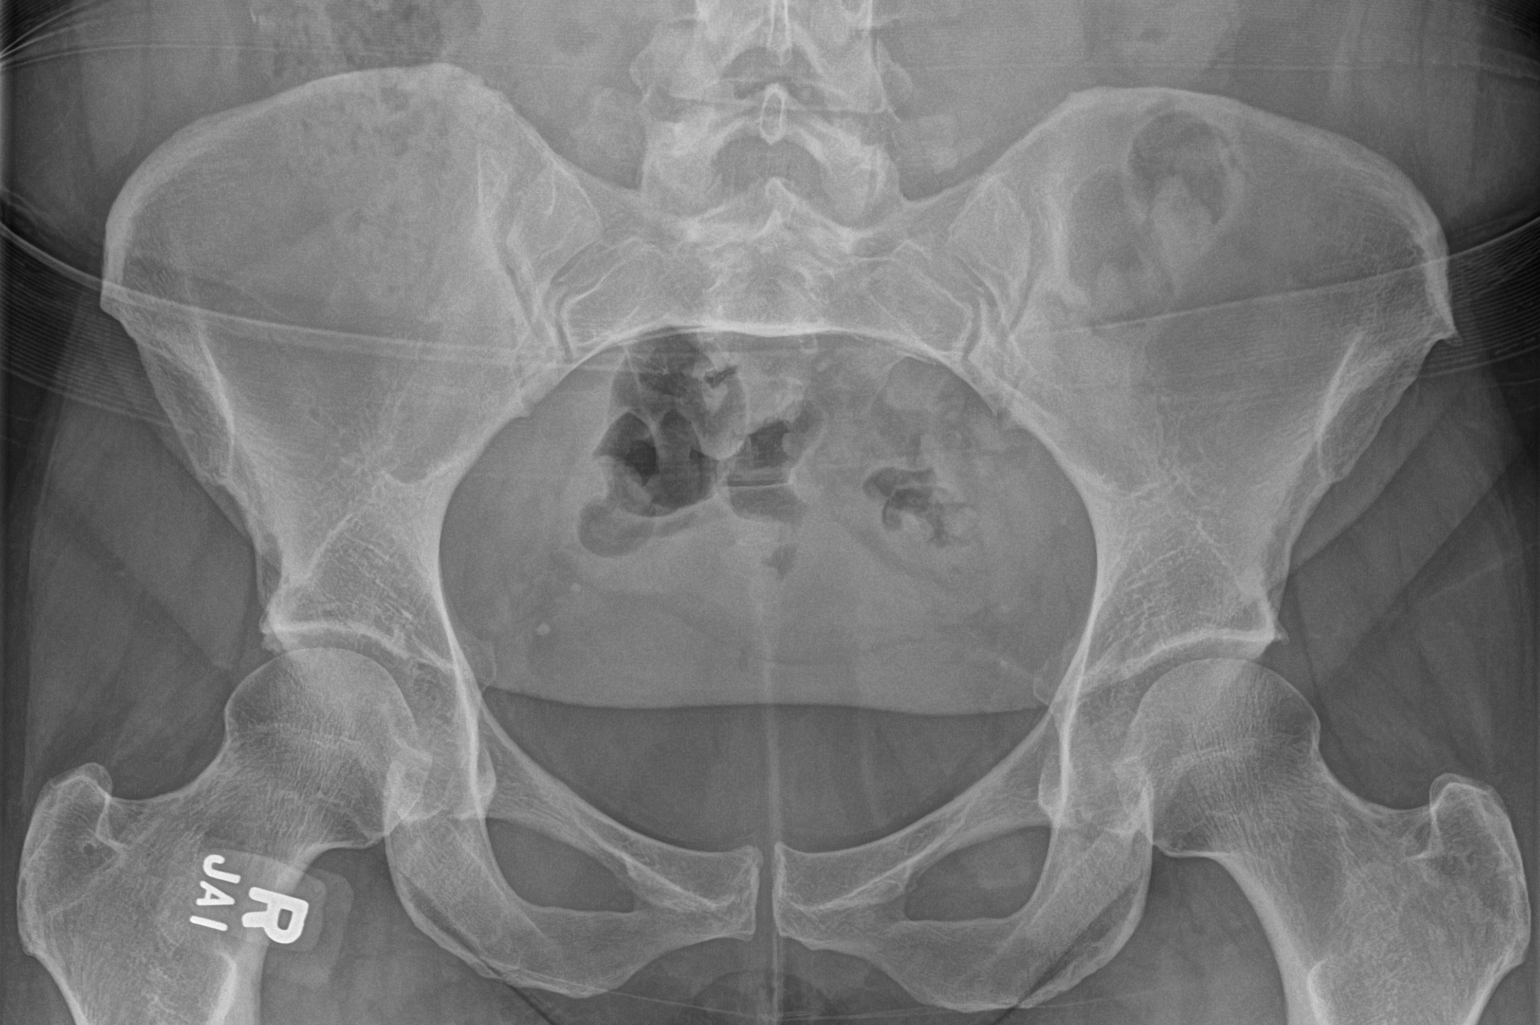

[hip ap]
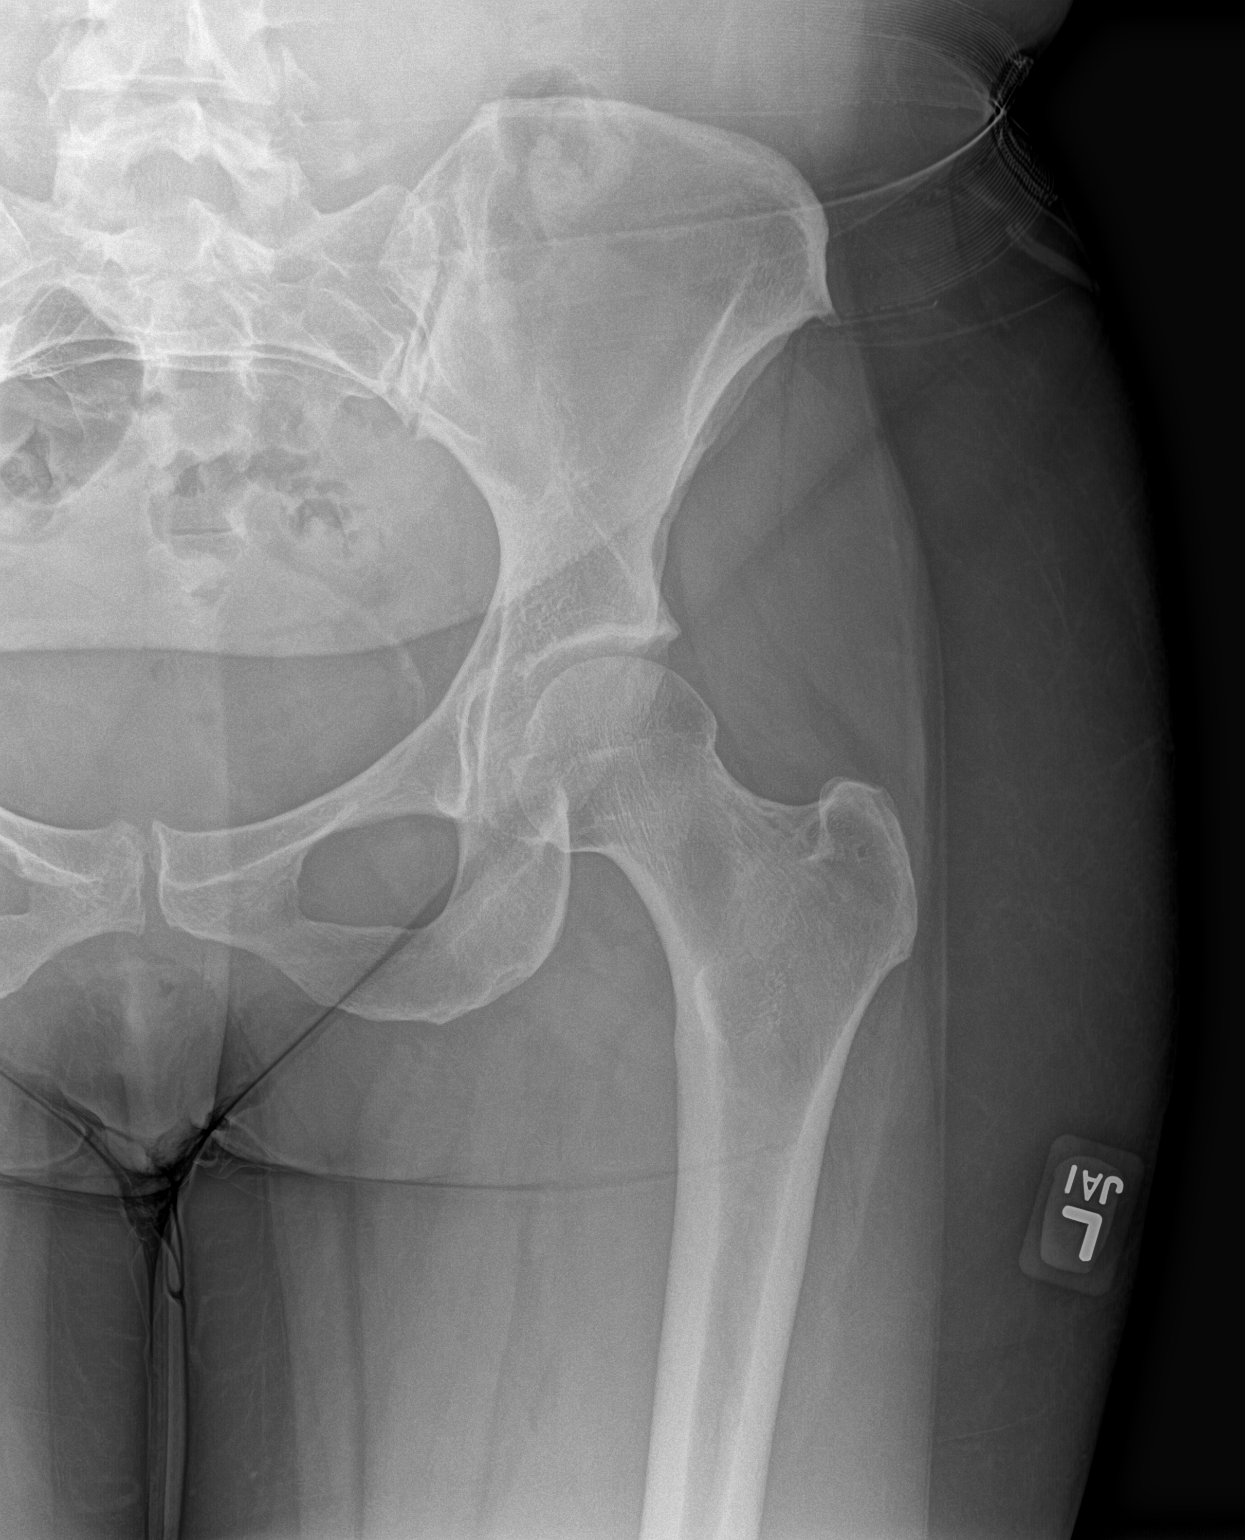

[hip frog leg]
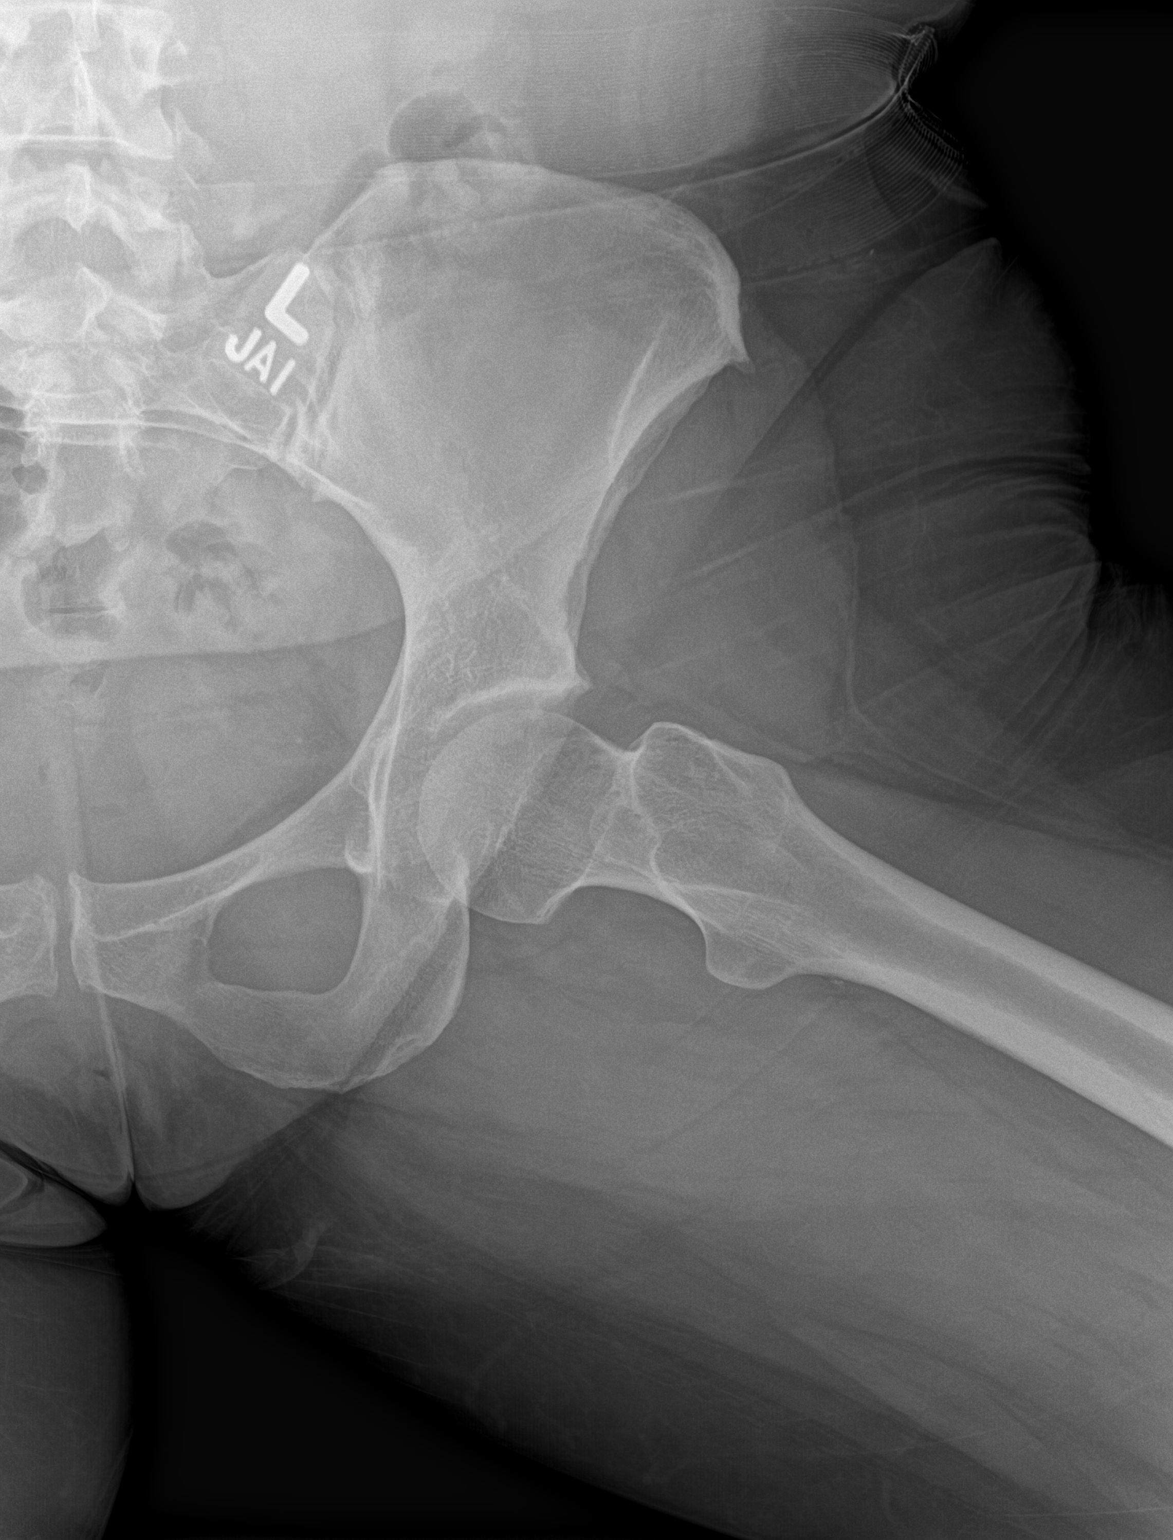

[3 of 3 positions shown; findings below may reference images not displayed]

FINDINGS: There is no evidence of hip fracture or dislocation. There is no
evidence of arthropathy or other focal bone abnormality.
IMPRESSION: Negative.

## 2022-07-30 ENCOUNTER — Encounter: Payer: Self-pay | Admitting: Internal Medicine

## 2022-07-30 ENCOUNTER — Other Ambulatory Visit (HOSPITAL_COMMUNITY): Payer: Self-pay

## 2022-07-30 ENCOUNTER — Telehealth: Payer: Self-pay

## 2022-07-30 ENCOUNTER — Ambulatory Visit (INDEPENDENT_AMBULATORY_CARE_PROVIDER_SITE_OTHER): Payer: No Typology Code available for payment source

## 2022-07-30 ENCOUNTER — Ambulatory Visit (INDEPENDENT_AMBULATORY_CARE_PROVIDER_SITE_OTHER): Payer: No Typology Code available for payment source | Admitting: Internal Medicine

## 2022-07-30 VITALS — BP 118/72 | HR 81 | Temp 99.3°F | Ht 65.0 in | Wt 184.8 lb

## 2022-07-30 DIAGNOSIS — S62653D Nondisplaced fracture of medial phalanx of left middle finger, subsequent encounter for fracture with routine healing: Secondary | ICD-10-CM

## 2022-07-30 DIAGNOSIS — E559 Vitamin D deficiency, unspecified: Secondary | ICD-10-CM | POA: Diagnosis not present

## 2022-07-30 DIAGNOSIS — E538 Deficiency of other specified B group vitamins: Secondary | ICD-10-CM

## 2022-07-30 DIAGNOSIS — R768 Other specified abnormal immunological findings in serum: Secondary | ICD-10-CM

## 2022-07-30 DIAGNOSIS — J9801 Acute bronchospasm: Secondary | ICD-10-CM

## 2022-07-30 DIAGNOSIS — Z1329 Encounter for screening for other suspected endocrine disorder: Secondary | ICD-10-CM | POA: Diagnosis not present

## 2022-07-30 DIAGNOSIS — R7989 Other specified abnormal findings of blood chemistry: Secondary | ICD-10-CM

## 2022-07-30 DIAGNOSIS — Z1283 Encounter for screening for malignant neoplasm of skin: Secondary | ICD-10-CM

## 2022-07-30 DIAGNOSIS — S63633A Sprain of interphalangeal joint of left middle finger, initial encounter: Secondary | ICD-10-CM | POA: Diagnosis not present

## 2022-07-30 DIAGNOSIS — R7689 Other specified abnormal immunological findings in serum: Secondary | ICD-10-CM

## 2022-07-30 DIAGNOSIS — S63633D Sprain of interphalangeal joint of left middle finger, subsequent encounter: Secondary | ICD-10-CM | POA: Diagnosis not present

## 2022-07-30 DIAGNOSIS — J309 Allergic rhinitis, unspecified: Secondary | ICD-10-CM

## 2022-07-30 DIAGNOSIS — L219 Seborrheic dermatitis, unspecified: Secondary | ICD-10-CM

## 2022-07-30 DIAGNOSIS — E611 Iron deficiency: Secondary | ICD-10-CM

## 2022-07-30 DIAGNOSIS — Z1389 Encounter for screening for other disorder: Secondary | ICD-10-CM

## 2022-07-30 DIAGNOSIS — Z0000 Encounter for general adult medical examination without abnormal findings: Secondary | ICD-10-CM | POA: Diagnosis not present

## 2022-07-30 LAB — CBC WITH DIFFERENTIAL/PLATELET
Basophils Absolute: 0 10*3/uL (ref 0.0–0.1)
Basophils Relative: 0.2 % (ref 0.0–3.0)
Eosinophils Absolute: 0.2 10*3/uL (ref 0.0–0.7)
Eosinophils Relative: 3.7 % (ref 0.0–5.0)
HCT: 41.1 % (ref 36.0–46.0)
Hemoglobin: 13.8 g/dL (ref 12.0–15.0)
Lymphocytes Relative: 29.5 % (ref 12.0–46.0)
Lymphs Abs: 2 10*3/uL (ref 0.7–4.0)
MCHC: 33.7 g/dL (ref 30.0–36.0)
MCV: 90.5 fl (ref 78.0–100.0)
Monocytes Absolute: 0.5 10*3/uL (ref 0.1–1.0)
Monocytes Relative: 8.1 % (ref 3.0–12.0)
Neutro Abs: 3.9 10*3/uL (ref 1.4–7.7)
Neutrophils Relative %: 58.5 % (ref 43.0–77.0)
Platelets: 384 10*3/uL (ref 150.0–400.0)
RBC: 4.54 Mil/uL (ref 3.87–5.11)
RDW: 13.5 % (ref 11.5–15.5)
WBC: 6.6 10*3/uL (ref 4.0–10.5)

## 2022-07-30 LAB — IBC + FERRITIN
Ferritin: 25.4 ng/mL (ref 10.0–291.0)
Iron: 78 ug/dL (ref 42–145)
Saturation Ratios: 20.3 % (ref 20.0–50.0)
TIBC: 385 ug/dL (ref 250.0–450.0)
Transferrin: 275 mg/dL (ref 212.0–360.0)

## 2022-07-30 LAB — VITAMIN D 25 HYDROXY (VIT D DEFICIENCY, FRACTURES): VITD: 19.16 ng/mL — ABNORMAL LOW (ref 30.00–100.00)

## 2022-07-30 LAB — LIPID PANEL
Cholesterol: 173 mg/dL (ref 0–200)
HDL: 61.5 mg/dL (ref >39.00)
LDL Cholesterol: 92 mg/dL (ref 0–99)
NonHDL: 111.4
Total CHOL/HDL Ratio: 3
Triglycerides: 95 mg/dL (ref 0.0–149.0)
VLDL: 19 mg/dL (ref 0.0–40.0)

## 2022-07-30 LAB — COMPREHENSIVE METABOLIC PANEL
ALT: 12 U/L (ref 0–35)
AST: 17 U/L (ref 0–37)
Albumin: 4.5 g/dL (ref 3.5–5.2)
Alkaline Phosphatase: 75 U/L (ref 39–117)
BUN: 9 mg/dL (ref 6–23)
CO2: 28 mEq/L (ref 19–32)
Calcium: 9.4 mg/dL (ref 8.4–10.5)
Chloride: 100 mEq/L (ref 96–112)
Creatinine, Ser: 0.81 mg/dL (ref 0.40–1.20)
GFR: 91.75 mL/min (ref >60.00)
Glucose, Bld: 84 mg/dL (ref 70–99)
Potassium: 3.6 mEq/L (ref 3.5–5.1)
Sodium: 137 mEq/L (ref 135–145)
Total Bilirubin: 0.4 mg/dL (ref 0.2–1.2)
Total Protein: 7.6 g/dL (ref 6.0–8.3)

## 2022-07-30 LAB — T4, FREE: Free T4: 0.7 ng/dL (ref 0.60–1.60)

## 2022-07-30 LAB — VITAMIN B12: Vitamin B-12: 246 pg/mL (ref 211–911)

## 2022-07-30 LAB — TSH: TSH: 1.65 u[IU]/mL (ref 0.35–5.50)

## 2022-07-30 MED ORDER — ALBUTEROL SULFATE HFA 108 (90 BASE) MCG/ACT IN AERS
1.0000 | INHALATION_SPRAY | Freq: Four times a day (QID) | RESPIRATORY_TRACT | 12 refills | Status: AC | PRN
  Filled 2022-07-30: qty 6.7, 25d supply, fill #0

## 2022-07-30 MED ORDER — KETOCONAZOLE 2 % EX SHAM
1.0000 | MEDICATED_SHAMPOO | CUTANEOUS | 11 refills | Status: DC
  Filled 2022-07-30: qty 120, 30d supply, fill #0

## 2022-07-30 MED ORDER — SERTRALINE HCL 100 MG PO TABS
ORAL_TABLET | ORAL | 1 refills | Status: DC
  Filled 2022-07-30: qty 135, 90d supply, fill #0

## 2022-07-30 MED ORDER — FLUTICASONE PROPIONATE 50 MCG/ACT NA SUSP
2.0000 | Freq: Every day | NASAL | 11 refills | Status: AC
  Filled 2022-07-30: qty 16, 30d supply, fill #0

## 2022-07-30 MED ORDER — AMPHETAMINE-DEXTROAMPHETAMINE 10 MG PO TABS
10.0000 mg | ORAL_TABLET | Freq: Every day | ORAL | 0 refills | Status: DC
  Filled 2022-07-30: qty 90, 90d supply, fill #0

## 2022-07-30 MED ORDER — VYVANSE 40 MG PO CAPS
40.0000 mg | ORAL_CAPSULE | Freq: Every day | ORAL | 0 refills | Status: DC
  Filled 2022-07-30 – 2022-10-23 (×2): qty 90, 90d supply, fill #0

## 2022-07-30 NOTE — Telephone Encounter (Signed)
Lvm for pt to return call in regards to x-ray results.  Per Dr.Tracy: Nondisplaced fracture left 3rd middle part of finger  I rec you f/u hand ortho  Do you want referral [facility] in [place]?

## 2022-07-30 NOTE — Patient Instructions (Addendum)
Call to schedule ob/gyn [facility]  Dr. Volanda Napoleon new PCP   Finger Sprain, Adult A finger sprain is a tear or stretch in a ligament in a finger. Ligaments are tissues that connect bones to each other. What are the causes? Finger sprains happen when something makes the bones in the hand move in an abnormal way. They are often caused by a fall or an accident. What increases the risk? This condition is more likely to develop in people who: Participate in sports in which it is easy to fall, such as skiing. Play sports that involve catching an object, such as basketball. Have poor strength and flexibility. What are the signs or symptoms? Symptoms of this condition include: Pain or tenderness in the finger. Swelling in the finger. A bluish appearance to the finger. Bruising. Difficulty bending and flexing the finger. How is this diagnosed? This condition is diagnosed with an exam of your finger. Your health care provider may take an X-ray to see if any bones are broken or dislocated. How is this treated? Treatment for this condition depends on how severe the sprain is. It may involve: Preventing the finger from moving for a period of time. Your finger may be wrapped in a bandage (dressing) or splint, or your finger may be taped to the fingers beside it (buddy taping). Medicines for pain. Exercises to strengthen the finger. These may be recommended when the finger has healed. Surgery to reconnect the ligament to a bone. This may be done if the ligament was completely torn. Follow these instructions at home: If you have a removable splint: Wear the splint as told by your health care provider. Remove it only as told by your health care provider. Check the skin around the splint every day. Tell your health care provider about any concerns. Loosen the splint if your fingers tingle, become numb, or turn cold and blue. Keep the splint clean. If the splint is not waterproof: Do not let it get  wet. Cover it with a watertight covering when you take a bath or shower. Managing pain, stiffness, and swelling If directed, put ice on the injured area. To do this: If you have a removable splint, remove it as told by your health care provider. Put ice in a plastic bag. Place a towel between your skin and the bag. Leave the ice on for 20 minutes, 2-3 times a day. Remove the ice if your skin turns bright red. This is very important. If you cannot feel pain, heat, or cold, you have a greater risk of damage to the area. Move your fingers often to reduce stiffness and swelling. Raise (elevate) the injured area above the level of your heart while you are sitting or lying down. Medicines Take over-the-counter and prescription medicines only as told by your health care provider. Ask your health care provider if the medicine prescribed to you requires you to avoid driving or using machinery. General instructions Keep any dressings dry until your health care provider says they can be removed. If your fingers are buddy taped, replace your buddy taping as told by your health care provider. Do exercises as told by your health care provider or physical therapist. Do not wear rings on your injured finger. Keep all follow-up visits. This is important. Contact a health care provider if: Your pain is not controlled with medicine. Your bruising or swelling gets worse. Your splint is damaged. You develop a fever. Get help right away if: Your finger is numb or blue. Your  finger feels colder to the touch than normal. Summary A finger sprain is a tear or stretch in a ligament in a finger. Ligaments are tissues that connect bones to each other. Finger sprains happen when something makes the bones in the hand move in an abnormal way. They are often caused by a fall or accident. This condition is diagnosed with an exam of your finger. Your health care provider may do an X-ray to see if any bones are broken or  dislocated. Treatment for this condition depends on how severe the sprain is. Treatment may involve buddy taping or wearing a splint. Surgery to reconnect the ligament to a bone may be needed if the ligament was torn all the way. This information is not intended to replace advice given to you by your health care provider. Make sure you discuss any questions you have with your health care provider. Document Revised: 11/08/2020 Document Reviewed: 11/08/2020 Elsevier Patient Education  [place].

## 2022-07-30 NOTE — Progress Notes (Addendum)
Chief Complaint  Patient presents with   Annual Exam   Annual  1. 07/27/22 finger injury riding mechanical bull has splint on tried otc nsaids  Mild pain splint helping    Review of Systems  Constitutional:  Negative for weight loss.  HENT:  Negative for hearing loss.   Eyes:  Negative for blurred vision.  Respiratory:  Negative for shortness of breath.   Cardiovascular:  Negative for chest pain.  Gastrointestinal:  Negative for abdominal pain and blood in stool.  Genitourinary:  Negative for dysuria.  Musculoskeletal:  Negative for falls and joint pain.  Skin:  Negative for rash.  Neurological:  Negative for headaches.  Psychiatric/Behavioral:  Negative for depression.    Past Medical History:  Diagnosis Date   Abnormal Pap smear of cervix    ASCUS, +HPV   Anxiety    Bunion, left foot    Chicken pox    Constipation    Constipation    COVID-19    06/2021   Genital herpes    Labral tear of left hip joint    PRP inj with Dr. Glennon Mac Leb as of 2023   Microadenoma    pituitary Dr. Buddy Duty   Ovarian cyst    left s/p rupture imaing 2020/2021   PTSD (post-traumatic stress disorder)    previous marital issues (ex husband)   Past Surgical History:  Procedure Laterality Date   APPENDECTOMY  2003   TUBAL LIGATION N/A 06/28/2016   Procedure: POST PARTUM TUBAL LIGATION;  Surgeon: Boykin Nearing, MD;  Location: [facility] ORS;  Service: Gynecology;  Laterality: N/A;   Family History  Problem Relation Age of Onset   Heart disease Maternal Grandmother    Hyperlipidemia Maternal Grandmother    Stroke Maternal Grandmother    Hyperlipidemia Mother    Obesity Mother    Alcohol abuse Paternal Grandfather    Obesity Father    Obesity Sister    Crohn's disease Sister        s/p colectomy age 60 y.o   Other Sister        covid 76   Obesity Brother    Lung cancer Maternal Grandfather        smoker   Colon cancer Neg Hx    Colitis Neg Hx    Diabetes Neg Hx    Kidney disease Neg  Hx    Liver disease Neg Hx    Social History   Socioeconomic History   Marital status: Married    Spouse name: Not on file   Number of children: 2   Years of education: 18   Highest education level: Not on file  Occupational History   Occupation: Designer, jewellery    Comment: [employer]   Smoking status: Former    Packs/day: 1.00    Years: 8.00    Total pack years: 8.00    Types: Cigarettes    Quit date: 12/30/2004    Years since quitting: 17.5   Smokeless tobacco: Never   Tobacco comments:    in college only  Substance and Sexual Activity   Alcohol use: Yes    Alcohol/week: 2.0 standard drinks of alcohol    Types: 2 Standard drinks or equivalent per week    Comment: 2 or less drinks weekly   Drug use: No   Sexual activity: Yes    Partners: Male    Birth control/protection: Surgical  Other Topics Concern   Not on file  Social  History Narrative   Chrislyn grew up in [place]. 3 kids and 1 step son. Nikelle is currently divorced and re-married. She attended [employer] for her Bachelors in Nursing. She then attended [employer] for her Masters in Adult Oncology Nurse Practitioner. She is working at [employer]. She spends all of her spare time with her children husband and dog      Caffeine - 2 cups of coffee daily then water   Exercise - active   APP of year 2021 won award    Social Determinants of Radio broadcast assistant Strain: Not on file  Food Insecurity: Not on file  Transportation Needs: Not on file  Physical Activity: Not on file  Stress: Not on file  Social Connections: Not on file  Intimate Partner Violence: Not on file   Current Meds  Medication Sig   Chlorpheniramine-Phenylephrine (SUDAFED PE SINUS/ALLERGY PO) Take by mouth.   clobetasol (OLUX) 0.05 % topical foam Apply topically 2 (two) times daily. Prn   lisdexamfetamine (VYVANSE) 40 MG capsule Take 1 capsule by mouth daily   sertraline (ZOLOFT) 100 MG  tablet Take 1 and 1/2 tablets by mouth daily with breakfast   sodium chloride (OCEAN) 0.65 % SOLN nasal spray Place 1-2 sprays into both nostrils daily.   [DISCONTINUED] albuterol (VENTOLIN HFA) 108 (90 Base) MCG/ACT inhaler Inhale 1-2 puffs into the lungs every 6 (six) hours as needed for wheezing or shortness of breath.   [DISCONTINUED] fluticasone (FLONASE) 50 MCG/ACT nasal spray Place 2 sprays into both nostrils daily.   [DISCONTINUED] ketoconazole (NIZORAL) 2 % shampoo Apply 1 application topically 2 (two) times a week. Let  Seat on x 5 + minutes   Allergies  Allergen Reactions   Sulfa Antibiotics Anaphylaxis   Erythromycin Other (See Comments)    Unknown rxn, childhood allergy   Iron Itching   No results found for this or any previous visit (from the past 2160 hour(s)). Objective  Body mass index is 30.75 kg/m. Wt Readings from Last 3 Encounters:  07/30/22 184 lb 12.8 oz (83.8 kg)  07/11/22 182 lb (82.6 kg)  06/21/22 180 lb (81.6 kg)   Temp Readings from Last 3 Encounters:  07/30/22 99.3 F (37.4 C) (Oral)  07/25/21 (!) 97 F (36.1 C) (Temporal)  10/16/20 98 F (36.7 C) (Oral)   BP Readings from Last 3 Encounters:  07/30/22 118/72  07/11/22 110/76  06/21/22 118/82   Pulse Readings from Last 3 Encounters:  07/30/22 81  07/11/22 92  06/21/22 87    Physical Exam Vitals and nursing note reviewed.  Constitutional:      Appearance: Normal appearance. She is well-developed and well-groomed.  HENT:     Head: Normocephalic and atraumatic.  Eyes:     Conjunctiva/sclera: Conjunctivae normal.     Pupils: Pupils are equal, round, and reactive to light.  Cardiovascular:     Rate and Rhythm: Normal rate and regular rhythm.     Heart sounds: Normal heart sounds. No murmur heard. Pulmonary:     Effort: Pulmonary effort is normal.     Breath sounds: Normal breath sounds.  Abdominal:     General: Abdomen is flat. Bowel sounds are normal.     Tenderness: There is no  abdominal tenderness.  Musculoskeletal:        General: No tenderness.  Skin:    General: Skin is warm and dry.  Neurological:     General: No focal deficit present.     Mental  Status: She is alert and oriented to person, place, and time. Mental status is at baseline.     Cranial Nerves: Cranial nerves 2-12 are intact.     Motor: Motor function is intact.     Coordination: Coordination is intact.     Gait: Gait is intact.  Psychiatric:        Attention and Perception: Attention and perception normal.        Mood and Affect: Mood and affect normal.        Speech: Speech normal.        Behavior: Behavior normal. Behavior is cooperative.        Thought Content: Thought content normal.        Cognition and Memory: Cognition and memory normal.        Judgment: Judgment normal.     Assessment  Plan  Annual physical exam - Plan: Comprehensive metabolic panel, Lipid panel, CBC w/Diff See below   Sprain of interphalangeal joint of left middle finger, subsequent encounter - Plan: DG Finger Middle Left Info given  EXAM: LEFT MIDDLE FINGER 2+V   COMPARISON:  None Available.   FINDINGS: Comminuted nondisplaced fracture is identified left third middle phalanx. There is no dislocation   IMPRESSION: Comminuted nondisplaced fracture of left third middle phalanx.     Electronically Signed   By: Abelardo Diesel M.D.   On: 07/30/2022 13:27   [facility] Dr. Amedeo Plenty   Elevated prolactin level - Plan: Prolactin F/u Dr. Louanna Raw endocrine in [place] 07/31/22   Iron deficiency - Plan: IBC + Ferritin   Skin cancer screening - Plan: Ambulatory referral to Dermatology  Bronchospasm - Plan: albuterol (VENTOLIN HFA) 108 (90 Base) MCG/ACT inhaler  Seborrheic dermatitis of scalp - Plan: ketoconazole (NIZORAL) 2 % shampoo  Allergic rhinitis, unspecified seasonality, unspecified trigger - Plan: fluticasone (FLONASE) 50 MCG/ACT nasal spray  Pt no longer taking zyrtec   ANA + referred  rheumatology Dr. Amil Amen   1:320 High   1:160 High  CM    Comment:                 Reference Range                  <1:40        Negative                  1:40-1:80    Low Antibody Level                  >1:80        Elevated Antibody Level  .   ANA Pattern 1  Nuclear, Homogeneous Abnormal   Nuclear, Homogeneous Abnormal  CM   Comment: Homogeneous pattern is associated with systemic lupus  erythematosus (SLE), drug-induced lupus and juvenile  idiopathic arthritis.  .  AC-1: Homogeneous  .  International Consensus on ANA Patterns  (https://www.hernandez-brewer.com/)   ANA TITER titer 1:40 High   1:40 High  CM   Comment: A low level ANA titer may be present in pre-clinical  autoimmune diseases and normal individuals.                  Reference Range                  <1:40        Negative                  1:40-1:80    Low Antibody Level                  >  1:80        Elevated Antibody Level  .   ANA PATTERN  Cytoplasmic Abnormal   Cytoplasmic Abnormal  CM   Comment: The presence of cytoplasmic fluorescence was noted on  the HEp-2 slide. Other reactivities (e.g., anti-  mitochondrial antibodies or anti-smooth muscle  antibodies) may be responsible for this fluorescence.  The clinical significance of this finding is uncertain.  Clinical correlation is recommended.  .  AC-15 to AC-23: Cytoplasmic  .  International Consensus on ANA Patterns  (https://www.hernandez-brewer.com/)   Resulting Agency  [facility]         Resulting Agency's Comment      HM UTD flu 08/2020 Tdap utd 04/04/16 covid 3/3 2nd dose LAD, fatigue, fevers, chills had covid 06/2021+ consider 4th dose in future declines due to lymph nodes swollen MMR immune 11/04/00 Hep B immune 08/26/05 TB neg   OB/[facility]N [facility] now Dr. Murrell Redden 05/2020 appt 08/23/21, call appt as of 07/30/22    Hep B immune and had MMR vaccine     Mammogram [facility] 09/26/18 negative repeat  age 83 y.o    Colonoscopy age 64 y.o    H/o neg Hep C, HIV   continue exercise and healthy diet     Provider: Dr. Olivia Mackie McLean-Scocuz[facility]

## 2022-07-31 ENCOUNTER — Other Ambulatory Visit: Payer: Self-pay | Admitting: Internal Medicine

## 2022-07-31 ENCOUNTER — Other Ambulatory Visit (HOSPITAL_COMMUNITY): Payer: Self-pay

## 2022-07-31 DIAGNOSIS — E538 Deficiency of other specified B group vitamins: Secondary | ICD-10-CM

## 2022-07-31 LAB — URINALYSIS, ROUTINE W REFLEX MICROSCOPIC
Bilirubin Urine: NEGATIVE
Glucose, UA: NEGATIVE
Hgb urine dipstick: NEGATIVE
Ketones, ur: NEGATIVE
Leukocytes,Ua: NEGATIVE
Nitrite: NEGATIVE
Protein, ur: NEGATIVE
Specific Gravity, Urine: 1.02 (ref 1.001–1.035)
pH: 8 (ref 5.0–8.0)

## 2022-07-31 LAB — PROLACTIN: Prolactin: 14.1 ng/mL

## 2022-07-31 NOTE — Addendum Note (Signed)
Addended by: Orland Mustard on: 07/31/2022 01:03 PM   Modules accepted: Orders

## 2022-07-31 NOTE — Progress Notes (Signed)
Tried to call pt again today which will make my 2nd attempt to reach her. Each time it rings and she sends me to voicemail.

## 2022-07-31 NOTE — Addendum Note (Signed)
Addended by: Orland Mustard on: 07/31/2022 01:02 PM   Modules accepted: Orders

## 2022-08-01 ENCOUNTER — Other Ambulatory Visit: Payer: Self-pay | Admitting: Internal Medicine

## 2022-08-01 ENCOUNTER — Other Ambulatory Visit (HOSPITAL_COMMUNITY): Payer: Self-pay

## 2022-08-01 DIAGNOSIS — E559 Vitamin D deficiency, unspecified: Secondary | ICD-10-CM

## 2022-08-01 MED ORDER — CHOLECALCIFEROL 1.25 MG (50000 UT) PO CAPS
50000.0000 [IU] | ORAL_CAPSULE | ORAL | 1 refills | Status: DC
  Filled 2022-08-01: qty 13, 90d supply, fill #0

## 2022-08-02 ENCOUNTER — Other Ambulatory Visit: Payer: No Typology Code available for payment source

## 2022-08-06 ENCOUNTER — Other Ambulatory Visit (HOSPITAL_COMMUNITY): Payer: Self-pay

## 2022-08-06 MED ORDER — CABERGOLINE 0.5 MG PO TABS
ORAL_TABLET | ORAL | 4 refills | Status: DC
  Filled 2022-08-06: qty 23, 90d supply, fill #0
  Filled 2023-06-12: qty 16, 62d supply, fill #1

## 2022-08-07 ENCOUNTER — Other Ambulatory Visit (INDEPENDENT_AMBULATORY_CARE_PROVIDER_SITE_OTHER): Payer: No Typology Code available for payment source

## 2022-08-07 ENCOUNTER — Other Ambulatory Visit (HOSPITAL_COMMUNITY): Payer: Self-pay

## 2022-08-07 DIAGNOSIS — E538 Deficiency of other specified B group vitamins: Secondary | ICD-10-CM

## 2022-08-09 ENCOUNTER — Other Ambulatory Visit: Payer: Self-pay | Admitting: Family

## 2022-08-09 ENCOUNTER — Other Ambulatory Visit (HOSPITAL_COMMUNITY): Payer: Self-pay

## 2022-08-09 DIAGNOSIS — S62653D Nondisplaced fracture of medial phalanx of left middle finger, subsequent encounter for fracture with routine healing: Secondary | ICD-10-CM

## 2022-08-12 LAB — ANTI-PARIETAL ANTIBODY: PARIETAL CELL AB SCREEN: NEGATIVE

## 2022-08-12 LAB — INTRINSIC FACTOR ANTIBODIES: Intrinsic Factor: NEGATIVE

## 2022-08-13 ENCOUNTER — Encounter: Payer: Self-pay | Admitting: Internal Medicine

## 2022-08-13 ENCOUNTER — Other Ambulatory Visit: Payer: Self-pay | Admitting: Family

## 2022-08-13 DIAGNOSIS — R7989 Other specified abnormal findings of blood chemistry: Secondary | ICD-10-CM

## 2022-08-14 ENCOUNTER — Other Ambulatory Visit: Payer: No Typology Code available for payment source

## 2022-08-15 ENCOUNTER — Other Ambulatory Visit: Payer: No Typology Code available for payment source

## 2022-08-23 ENCOUNTER — Other Ambulatory Visit (INDEPENDENT_AMBULATORY_CARE_PROVIDER_SITE_OTHER): Payer: No Typology Code available for payment source

## 2022-08-23 DIAGNOSIS — R7989 Other specified abnormal findings of blood chemistry: Secondary | ICD-10-CM | POA: Diagnosis not present

## 2022-08-26 LAB — ANTI-NUCLEAR AB-TITER (ANA TITER)
ANA TITER: 1:40 {titer} — ABNORMAL HIGH
ANA TITER: 1:40 {titer} — ABNORMAL HIGH
ANA Titer 1: 1:160 {titer} — ABNORMAL HIGH
ANA Titer 1: 1:320 {titer} — ABNORMAL HIGH

## 2022-08-26 LAB — ANA, IFA COMPREHENSIVE PANEL
Anti Nuclear Antibody (ANA): POSITIVE — AB
ENA SM Ab Ser-aCnc: <1 AI
SM/RNP: <1 AI
SSA (Ro) (ENA) Antibody, IgG: <1 AI
SSB (La) (ENA) Antibody, IgG: <1 AI
Scleroderma (Scl-70) (ENA) Antibody, IgG: <1 AI
ds DNA Ab: 1 IU/mL

## 2022-08-26 LAB — ANA: Anti Nuclear Antibody (ANA): POSITIVE — AB

## 2022-08-29 ENCOUNTER — Telehealth: Payer: Self-pay

## 2022-08-29 ENCOUNTER — Encounter: Payer: Self-pay | Admitting: Internal Medicine

## 2022-08-29 NOTE — Telephone Encounter (Signed)
LMOM top CB in regards to labs:   McLean-Scocuzza, Nino Glow, MD  Gracy Racer, CMA Ana + with high titer  Is she agreeable to see rheumatology Dr. Amil Amen further w/u ?  Intrinsic factor antibodies negative

## 2022-09-02 DIAGNOSIS — R768 Other specified abnormal immunological findings in serum: Secondary | ICD-10-CM | POA: Insufficient documentation

## 2022-09-02 NOTE — Addendum Note (Signed)
Addended by: Orland Mustard on: 09/02/2022 08:05 PM   Modules accepted: Orders

## 2022-09-05 ENCOUNTER — Telehealth: Payer: Self-pay

## 2022-09-05 NOTE — Telephone Encounter (Signed)
LMTCB

## 2022-09-05 NOTE — Telephone Encounter (Signed)
-----   Message from Delorise Jackson, MD sent at 08/27/2022  1:05 PM EDT ----- Suzanne Evans + with high titer  Is she agreeable to see rheumatology Dr. Amil Amen further w/u ?  Intrinsic factor antibodies negative

## 2022-10-23 ENCOUNTER — Other Ambulatory Visit (HOSPITAL_COMMUNITY): Payer: Self-pay

## 2022-11-06 ENCOUNTER — Other Ambulatory Visit (HOSPITAL_COMMUNITY): Payer: Self-pay

## 2022-11-06 ENCOUNTER — Encounter: Payer: Self-pay | Admitting: Family Medicine

## 2022-11-06 ENCOUNTER — Ambulatory Visit (INDEPENDENT_AMBULATORY_CARE_PROVIDER_SITE_OTHER): Payer: No Typology Code available for payment source | Admitting: Family Medicine

## 2022-11-06 ENCOUNTER — Telehealth: Payer: Self-pay

## 2022-11-06 VITALS — BP 116/72 | HR 92 | Temp 98.2°F | Ht 65.0 in | Wt 187.6 lb

## 2022-11-06 DIAGNOSIS — E559 Vitamin D deficiency, unspecified: Secondary | ICD-10-CM | POA: Diagnosis not present

## 2022-11-06 DIAGNOSIS — E538 Deficiency of other specified B group vitamins: Secondary | ICD-10-CM

## 2022-11-06 DIAGNOSIS — E221 Hyperprolactinemia: Secondary | ICD-10-CM

## 2022-11-06 DIAGNOSIS — F419 Anxiety disorder, unspecified: Secondary | ICD-10-CM

## 2022-11-06 DIAGNOSIS — D369 Benign neoplasm, unspecified site: Secondary | ICD-10-CM

## 2022-11-06 DIAGNOSIS — F4321 Adjustment disorder with depressed mood: Secondary | ICD-10-CM | POA: Diagnosis not present

## 2022-11-06 DIAGNOSIS — F909 Attention-deficit hyperactivity disorder, unspecified type: Secondary | ICD-10-CM

## 2022-11-06 DIAGNOSIS — D352 Benign neoplasm of pituitary gland: Secondary | ICD-10-CM

## 2022-11-06 LAB — VITAMIN B12: Vitamin B-12: 826 pg/mL (ref 211–911)

## 2022-11-06 LAB — VITAMIN D 25 HYDROXY (VIT D DEFICIENCY, FRACTURES): VITD: 73.47 ng/mL (ref 30.00–100.00)

## 2022-11-06 MED ORDER — SERTRALINE HCL 100 MG PO TABS
150.0000 mg | ORAL_TABLET | Freq: Every day | ORAL | 1 refills | Status: DC
  Filled 2022-11-06 – 2023-01-08 (×2): qty 135, 90d supply, fill #0

## 2022-11-06 MED ORDER — AMPHETAMINE-DEXTROAMPHETAMINE 10 MG PO TABS
ORAL_TABLET | ORAL | 0 refills | Status: DC
  Filled 2022-11-06: qty 90, 90d supply, fill #0

## 2022-11-06 MED ORDER — LISDEXAMFETAMINE DIMESYLATE 40 MG PO CAPS
ORAL_CAPSULE | Freq: Every day | ORAL | 0 refills | Status: AC
  Filled 2022-11-06: qty 90, 90d supply, fill #0

## 2022-11-06 NOTE — Patient Instructions (Signed)
It was a pleasure meeting you today. Thank you for allowing me to take part in your health care.  Our goals for today as we discussed include:  We will get some labs today.  If they are abnormal or we need to do something about them, I will call you.  If they are normal, I will send you a message on MyChart (if it is active) or a letter in the mail.  If you don't hear from [place] in 2 weeks, please call the office at the number below.    Please follow-up with PCP as needed  If you have any questions or concerns, please do not hesitate to call the office at [phone].  I look forward to our next visit and until then take care and stay safe.  Regards,   Carollee Leitz, MD   [facility]

## 2022-11-06 NOTE — Progress Notes (Signed)
    SUBJECTIVE:   CHIEF COMPLAINT / HPI: transfer of care  Patient presents to clinic to transfer care  No acute concerns today  Vit B 12 low Takes Vitamin B 12 sublingual 1000 mcg daily.  Would like repeat levels today.  Vit D low Takes Vit D 1.25 mg weekly.  Has had 12 weeks of treatment.  Doing well. Would like repeat levels today.  Prolactinoma Doing well.  Taking Cabergoline 0.5 mg twice a week.  Has annual MRI head and follows with Dr Buddy Duty at [facility].  ADHD/Depression Doing well.  Takes Vyvanse 40 mg daily and Adderall 10 mg 1-2 times a month. Takes Sertraline 100 mg daily.  Tolerating medication well. Follows with Dr Nicolasa Ducking in [place].  PERTINENT  PMH / PSH:  Prolactinoma Low Vitamin B12 Low Vitamin D ADHD/Mood   OBJECTIVE:   BP 116/72 (BP Location: Left Arm, Patient Position: Sitting, Cuff Size: Normal)   Pulse 92   Temp 98.2 F (36.8 C) (Oral)   Ht '5\' 5"'$  (1.651 m)   Wt 187 lb 9.6 oz (85.1 kg)   LMP 10/24/2022   SpO2 99%   BMI 31.22 kg/m    General: Alert, no acute distress Cardio: Normal S1 and S2, RRR, no r/m/g Pulm: CTAB, normal work of breathing Abdomen: Bowel sounds normal. Abdomen soft and non-tender.  Extremities: No peripheral edema.   ASSESSMENT/PLAN:   Vitamin D deficiency Repeat levels today.  If wnl can decrease Vitamin D to 400 IU daily  Situational depression Doing well Follows with Dr Nicolasa Ducking  Anxiety Doing well Follows with Dr Nicolasa Ducking  ADHD Doing well on Vyvanse and Adderall Follows with Dr Candee Furbish Doing well on Cabergoline Follow with Dr Buddy Duty.    HCM Last PAP with HPV cotesting  at [place], [place] on 07/2022- request results  PDMP Reviewed  Carollee Leitz, MD

## 2022-11-06 NOTE — Telephone Encounter (Signed)
Spoke to Patient to confirm how much Zoloft she is taking. Patient states she is taking 100 mg.

## 2022-11-14 ENCOUNTER — Other Ambulatory Visit (HOSPITAL_COMMUNITY): Payer: Self-pay

## 2022-11-14 MED ORDER — ONDANSETRON 4 MG PO TBDP
4.0000 mg | ORAL_TABLET | Freq: Four times a day (QID) | ORAL | 0 refills | Status: AC | PRN
  Filled 2022-11-14: qty 30, 8d supply, fill #0

## 2022-11-17 ENCOUNTER — Encounter: Payer: Self-pay | Admitting: Family Medicine

## 2022-11-17 DIAGNOSIS — D352 Benign neoplasm of pituitary gland: Secondary | ICD-10-CM | POA: Insufficient documentation

## 2022-11-17 NOTE — Assessment & Plan Note (Signed)
Doing well on Vyvanse and Adderall Follows with Dr Nicolasa Ducking

## 2022-11-17 NOTE — Assessment & Plan Note (Signed)
Doing well Follows with Dr Nicolasa Ducking

## 2022-11-17 NOTE — Assessment & Plan Note (Signed)
Doing well on Cabergoline Follow with Dr Buddy Duty.

## 2022-11-17 NOTE — Assessment & Plan Note (Signed)
Repeat levels today.  If wnl can decrease Vitamin D to 400 IU daily

## 2022-11-19 ENCOUNTER — Other Ambulatory Visit (HOSPITAL_COMMUNITY): Payer: Self-pay

## 2022-12-09 ENCOUNTER — Other Ambulatory Visit (HOSPITAL_COMMUNITY): Payer: Self-pay

## 2022-12-09 MED ORDER — WEGOVY 0.25 MG/0.5ML ~~LOC~~ SOAJ
0.2500 mg | SUBCUTANEOUS | 0 refills | Status: DC
  Filled 2022-12-09 – 2023-01-13 (×2): qty 2, 28d supply, fill #0

## 2023-01-01 ENCOUNTER — Other Ambulatory Visit (HOSPITAL_COMMUNITY): Payer: Self-pay

## 2023-01-08 ENCOUNTER — Other Ambulatory Visit (HOSPITAL_COMMUNITY): Payer: Self-pay

## 2023-01-13 ENCOUNTER — Other Ambulatory Visit (HOSPITAL_COMMUNITY): Payer: Self-pay

## 2023-01-15 ENCOUNTER — Other Ambulatory Visit: Payer: Self-pay

## 2023-01-17 ENCOUNTER — Other Ambulatory Visit (HOSPITAL_COMMUNITY): Payer: Self-pay

## 2023-01-27 ENCOUNTER — Other Ambulatory Visit (HOSPITAL_COMMUNITY): Payer: Self-pay

## 2023-01-27 MED ORDER — WEGOVY 0.5 MG/0.5ML ~~LOC~~ SOAJ
SUBCUTANEOUS | 0 refills | Status: DC
  Filled 2023-01-27: qty 2, 28d supply, fill #0

## 2023-02-04 ENCOUNTER — Other Ambulatory Visit (HOSPITAL_COMMUNITY): Payer: Self-pay

## 2023-02-04 DIAGNOSIS — F411 Generalized anxiety disorder: Secondary | ICD-10-CM | POA: Diagnosis not present

## 2023-02-04 DIAGNOSIS — F900 Attention-deficit hyperactivity disorder, predominantly inattentive type: Secondary | ICD-10-CM | POA: Diagnosis not present

## 2023-02-04 MED ORDER — SERTRALINE HCL 100 MG PO TABS
150.0000 mg | ORAL_TABLET | Freq: Every day | ORAL | 0 refills | Status: DC
  Filled 2023-02-04: qty 135, 90d supply, fill #0

## 2023-02-04 MED ORDER — AMPHETAMINE-DEXTROAMPHETAMINE 10 MG PO TABS
10.0000 mg | ORAL_TABLET | Freq: Every day | ORAL | 0 refills | Status: DC
  Filled 2023-02-04: qty 90, 90d supply, fill #0

## 2023-02-04 MED ORDER — LISDEXAMFETAMINE DIMESYLATE 40 MG PO CAPS
40.0000 mg | ORAL_CAPSULE | Freq: Every day | ORAL | 0 refills | Status: AC
  Filled 2023-02-04: qty 90, 90d supply, fill #0

## 2023-02-14 ENCOUNTER — Other Ambulatory Visit (HOSPITAL_COMMUNITY): Payer: Self-pay

## 2023-02-14 DIAGNOSIS — D2271 Melanocytic nevi of right lower limb, including hip: Secondary | ICD-10-CM | POA: Diagnosis not present

## 2023-02-14 DIAGNOSIS — L218 Other seborrheic dermatitis: Secondary | ICD-10-CM | POA: Diagnosis not present

## 2023-02-14 DIAGNOSIS — D1801 Hemangioma of skin and subcutaneous tissue: Secondary | ICD-10-CM | POA: Diagnosis not present

## 2023-02-14 DIAGNOSIS — D2261 Melanocytic nevi of right upper limb, including shoulder: Secondary | ICD-10-CM | POA: Diagnosis not present

## 2023-02-14 DIAGNOSIS — L821 Other seborrheic keratosis: Secondary | ICD-10-CM | POA: Diagnosis not present

## 2023-02-14 DIAGNOSIS — D2262 Melanocytic nevi of left upper limb, including shoulder: Secondary | ICD-10-CM | POA: Diagnosis not present

## 2023-02-14 DIAGNOSIS — D225 Melanocytic nevi of trunk: Secondary | ICD-10-CM | POA: Diagnosis not present

## 2023-02-14 MED ORDER — KETOCONAZOLE 2 % EX SHAM
MEDICATED_SHAMPOO | CUTANEOUS | 11 refills | Status: AC
  Filled 2023-02-14: qty 120, 30d supply, fill #0

## 2023-02-19 ENCOUNTER — Other Ambulatory Visit (HOSPITAL_COMMUNITY): Payer: Self-pay

## 2023-02-19 MED ORDER — WEGOVY 1 MG/0.5ML ~~LOC~~ SOAJ
1.0000 mg | SUBCUTANEOUS | 0 refills | Status: DC
  Filled 2023-02-19: qty 2, 28d supply, fill #0

## 2023-02-21 ENCOUNTER — Other Ambulatory Visit (HOSPITAL_COMMUNITY): Payer: Self-pay

## 2023-02-25 ENCOUNTER — Other Ambulatory Visit (HOSPITAL_COMMUNITY): Payer: Self-pay

## 2023-03-13 ENCOUNTER — Other Ambulatory Visit (HOSPITAL_COMMUNITY): Payer: Self-pay

## 2023-03-13 MED ORDER — WEGOVY 1 MG/0.5ML ~~LOC~~ SOAJ
1.0000 mg | SUBCUTANEOUS | 0 refills | Status: DC
  Filled 2023-03-13 – 2023-03-24 (×4): qty 2, 28d supply, fill #0

## 2023-03-14 ENCOUNTER — Other Ambulatory Visit (HOSPITAL_COMMUNITY): Payer: Self-pay

## 2023-03-17 ENCOUNTER — Other Ambulatory Visit (HOSPITAL_COMMUNITY): Payer: Self-pay

## 2023-03-22 ENCOUNTER — Other Ambulatory Visit (HOSPITAL_COMMUNITY): Payer: Self-pay

## 2023-03-24 ENCOUNTER — Other Ambulatory Visit (HOSPITAL_COMMUNITY): Payer: Self-pay

## 2023-03-24 ENCOUNTER — Other Ambulatory Visit: Payer: Self-pay

## 2023-04-17 ENCOUNTER — Other Ambulatory Visit (HOSPITAL_COMMUNITY): Payer: Self-pay

## 2023-04-17 MED ORDER — WEGOVY 1 MG/0.5ML ~~LOC~~ SOAJ
SUBCUTANEOUS | 0 refills | Status: DC
  Filled 2023-04-17: qty 2, 28d supply, fill #0

## 2023-05-07 DIAGNOSIS — F411 Generalized anxiety disorder: Secondary | ICD-10-CM | POA: Diagnosis not present

## 2023-05-07 DIAGNOSIS — F900 Attention-deficit hyperactivity disorder, predominantly inattentive type: Secondary | ICD-10-CM | POA: Diagnosis not present

## 2023-05-08 ENCOUNTER — Other Ambulatory Visit (HOSPITAL_COMMUNITY): Payer: Self-pay

## 2023-05-08 MED ORDER — SERTRALINE HCL 100 MG PO TABS
150.0000 mg | ORAL_TABLET | Freq: Every day | ORAL | 0 refills | Status: DC
  Filled 2023-05-08: qty 135, 90d supply, fill #0

## 2023-05-08 MED ORDER — LISDEXAMFETAMINE DIMESYLATE 40 MG PO CAPS
40.0000 mg | ORAL_CAPSULE | Freq: Every day | ORAL | 0 refills | Status: AC
  Filled 2023-05-08: qty 90, 90d supply, fill #0

## 2023-05-08 MED ORDER — AMPHETAMINE-DEXTROAMPHETAMINE 10 MG PO TABS
10.0000 mg | ORAL_TABLET | Freq: Every day | ORAL | 0 refills | Status: AC
  Filled 2023-05-08: qty 90, 90d supply, fill #0

## 2023-06-12 ENCOUNTER — Other Ambulatory Visit (HOSPITAL_COMMUNITY): Payer: Self-pay

## 2023-06-12 DIAGNOSIS — L603 Nail dystrophy: Secondary | ICD-10-CM | POA: Diagnosis not present

## 2023-06-12 MED ORDER — TRIAMCINOLONE ACETONIDE 0.1 % EX OINT
TOPICAL_OINTMENT | CUTANEOUS | 3 refills | Status: AC
  Filled 2023-06-12: qty 30, 30d supply, fill #0

## 2023-07-01 ENCOUNTER — Other Ambulatory Visit: Payer: Self-pay | Admitting: Internal Medicine

## 2023-07-01 DIAGNOSIS — D352 Benign neoplasm of pituitary gland: Secondary | ICD-10-CM

## 2023-07-01 DIAGNOSIS — E221 Hyperprolactinemia: Secondary | ICD-10-CM

## 2023-07-21 ENCOUNTER — Encounter: Payer: Self-pay | Admitting: Internal Medicine

## 2023-07-22 ENCOUNTER — Encounter: Payer: Self-pay | Admitting: Internal Medicine

## 2023-07-23 ENCOUNTER — Ambulatory Visit
Admission: RE | Admit: 2023-07-23 | Discharge: 2023-07-23 | Disposition: A | Discharge status: Home / Self Care | Payer: 59 | Source: Ambulatory Visit | Attending: Internal Medicine | Admitting: Internal Medicine

## 2023-07-23 DIAGNOSIS — D352 Benign neoplasm of pituitary gland: Secondary | ICD-10-CM

## 2023-07-23 DIAGNOSIS — E221 Hyperprolactinemia: Secondary | ICD-10-CM | POA: Diagnosis not present

## 2023-07-23 MED ORDER — GADOPICLENOL 0.5 MMOL/ML IV SOLN
7.5000 mL | Freq: Once | INTRAVENOUS | Status: AC | PRN
  Administered 2023-07-23: 7.5 mL via INTRAVENOUS

## 2023-07-30 ENCOUNTER — Encounter (INDEPENDENT_AMBULATORY_CARE_PROVIDER_SITE_OTHER): Payer: Self-pay

## 2023-08-04 ENCOUNTER — Encounter: Payer: No Typology Code available for payment source | Admitting: Family Medicine

## 2023-08-11 ENCOUNTER — Encounter: Payer: Self-pay | Admitting: Neurology

## 2023-08-12 ENCOUNTER — Other Ambulatory Visit (HOSPITAL_COMMUNITY): Payer: Self-pay

## 2023-08-12 DIAGNOSIS — F411 Generalized anxiety disorder: Secondary | ICD-10-CM | POA: Diagnosis not present

## 2023-08-12 DIAGNOSIS — Z8759 Personal history of other complications of pregnancy, childbirth and the puerperium: Secondary | ICD-10-CM | POA: Diagnosis not present

## 2023-08-12 DIAGNOSIS — O926 Galactorrhea: Secondary | ICD-10-CM | POA: Diagnosis not present

## 2023-08-12 DIAGNOSIS — R768 Other specified abnormal immunological findings in serum: Secondary | ICD-10-CM | POA: Diagnosis not present

## 2023-08-12 DIAGNOSIS — D352 Benign neoplasm of pituitary gland: Secondary | ICD-10-CM | POA: Diagnosis not present

## 2023-08-12 DIAGNOSIS — Z8349 Family history of other endocrine, nutritional and metabolic diseases: Secondary | ICD-10-CM | POA: Diagnosis not present

## 2023-08-12 DIAGNOSIS — E221 Hyperprolactinemia: Secondary | ICD-10-CM | POA: Diagnosis not present

## 2023-08-12 DIAGNOSIS — F900 Attention-deficit hyperactivity disorder, predominantly inattentive type: Secondary | ICD-10-CM | POA: Diagnosis not present

## 2023-08-12 DIAGNOSIS — R930 Abnormal findings on diagnostic imaging of skull and head, not elsewhere classified: Secondary | ICD-10-CM | POA: Diagnosis not present

## 2023-08-12 MED ORDER — CABERGOLINE 0.5 MG PO TABS
0.2500 mg | ORAL_TABLET | ORAL | 4 refills | Status: AC
Start: 2023-08-12 — End: ?
  Filled 2023-08-12: qty 8, 90d supply, fill #0
  Filled 2023-11-25: qty 16, 62d supply, fill #0

## 2023-08-12 MED ORDER — LISDEXAMFETAMINE DIMESYLATE 40 MG PO CAPS
40.0000 mg | ORAL_CAPSULE | Freq: Every day | ORAL | 0 refills | Status: DC
  Filled 2023-08-12 – 2023-08-28 (×2): qty 90, 90d supply, fill #0

## 2023-08-13 ENCOUNTER — Other Ambulatory Visit (HOSPITAL_COMMUNITY): Payer: Self-pay

## 2023-08-13 ENCOUNTER — Encounter: Payer: Self-pay | Admitting: Family Medicine

## 2023-08-13 ENCOUNTER — Ambulatory Visit (INDEPENDENT_AMBULATORY_CARE_PROVIDER_SITE_OTHER): Payer: 59 | Admitting: Family Medicine

## 2023-08-13 VITALS — BP 112/78 | HR 88 | Temp 98.0°F | Resp 16 | Ht 65.0 in | Wt 136.5 lb

## 2023-08-13 DIAGNOSIS — E559 Vitamin D deficiency, unspecified: Secondary | ICD-10-CM | POA: Diagnosis not present

## 2023-08-13 DIAGNOSIS — Z1231 Encounter for screening mammogram for malignant neoplasm of breast: Secondary | ICD-10-CM | POA: Diagnosis not present

## 2023-08-13 DIAGNOSIS — E538 Deficiency of other specified B group vitamins: Secondary | ICD-10-CM

## 2023-08-13 DIAGNOSIS — Z0000 Encounter for general adult medical examination without abnormal findings: Secondary | ICD-10-CM

## 2023-08-13 NOTE — Progress Notes (Signed)
SUBJECTIVE:   Chief Complaint  Patient presents with   Annual Exam   HPI Presents for annual physical  No acute concerns  Has appointment with Neurology for follow up MRI head that recently found som nonspecific T2 FLAIR hyperintensities.  She is concerned as she has history of Prolactinoma and also elevated ANA, with negative ENA.   Currently takes Carbergoline for Prolactinoma.     Continues to follow with Dr Maryruth Bun for ADHD and Mood Disorder No changes in medications.  Work is going well  Has upcoming 42th birthday party to be celebrated and looking forward to this  Denies SI/HI No recent falls   PERTINENT PMH / PSH: Prolactinoma Migraines  OBJECTIVE:  BP 112/78   Pulse 88   Temp 98 F (36.7 C)   Resp 16   Ht 5\' 5"  (1.651 m)   Wt 136 lb 8 oz (61.9 kg)   LMP 08/01/2023   SpO2 96%   BMI 22.71 kg/m    Physical Exam Vitals reviewed.  Constitutional:      General: She is not in acute distress.    Appearance: She is not ill-appearing.  HENT:     Head: Normocephalic.     Right Ear: Tympanic membrane, ear canal and external ear normal.     Left Ear: Tympanic membrane, ear canal and external ear normal.     Nose: Nose normal.     Mouth/Throat:     Mouth: Mucous membranes are moist.  Eyes:     Extraocular Movements: Extraocular movements intact.     Conjunctiva/sclera: Conjunctivae normal.     Pupils: Pupils are equal, round, and reactive to light.  Neck:     Thyroid: No thyromegaly or thyroid tenderness.     Vascular: No carotid bruit.  Cardiovascular:     Rate and Rhythm: Normal rate and regular rhythm.     Pulses: Normal pulses.     Heart sounds: Normal heart sounds.  Pulmonary:     Effort: Pulmonary effort is normal.     Breath sounds: Normal breath sounds.  Abdominal:     General: Bowel sounds are normal. There is no distension.     Palpations: Abdomen is soft.     Tenderness: There is no abdominal tenderness. There is no right CVA tenderness,  left CVA tenderness, guarding or rebound.  Musculoskeletal:        General: Normal range of motion.     Cervical back: Normal range of motion.     Right lower leg: No edema.     Left lower leg: No edema.  Lymphadenopathy:     Cervical: No cervical adenopathy.  Skin:    Capillary Refill: Capillary refill takes less than 2 seconds.  Neurological:     General: No focal deficit present.     Mental Status: She is alert and oriented to person, place, and time. Mental status is at baseline.     Motor: No weakness.  Psychiatric:        Mood and Affect: Mood normal.        Behavior: Behavior normal.        Thought Content: Thought content normal.        Judgment: Judgment normal.        08/13/2023    3:04 PM 11/06/2022    8:56 AM 07/30/2022    8:08 AM 07/25/2021    2:32 PM 09/26/2020   11:13 AM  Depression screen PHQ 2/9  Decreased Interest 0 0 1 0  0  Down, Depressed, Hopeless 0 0 0 0 0  PHQ - 2 Score 0 0 1 0 0  Altered sleeping 1      Tired, decreased energy 0      Change in appetite 0      Feeling bad or failure about yourself  0      Trouble concentrating 1      Moving slowly or fidgety/restless 0      Suicidal thoughts 0      PHQ-9 Score 2          08/13/2023    3:04 PM 09/26/2020   11:13 AM 04/07/2020    1:51 PM 07/17/2018    4:12 PM  GAD 7 : Generalized Anxiety Score  Nervous, Anxious, on Edge 0 0 0 2  Control/stop worrying 0 0 0 2  Worry too much - different things 0 0 0 2  Trouble relaxing 1 0 0 2  Restless 0 0 0 0  Easily annoyed or irritable 1 0 0 1  Afraid - awful might happen 0 0 0 3  Total GAD 7 Score 2 0 0 12  Anxiety Difficulty Not difficult at all  Not difficult at all Very difficult    ASSESSMENT/PLAN:  Annual physical exam Assessment & Plan: Mammogram referral sent Recommend regular self breast exams PAP due, follows with OBGYN A1c, Lipid screening at next visit Normotensive PHQ9/GAD scores low, follows with psychiatry Colonoscopy at age  63-50    Vitamin D deficiency Assessment & Plan: Check Vitamin D level  Orders: -     VITAMIN D 25 Hydroxy (Vit-D Deficiency, Fractures)  Vitamin B 12 deficiency Assessment & Plan: Check B 12 level  Orders: -     Vitamin B12  Breast cancer screening by mammogram -     3D Screening Mammogram, Left and Right; Future   PDMP reviewed  Return if symptoms worsen or fail to improve.  Dana Allan, MD

## 2023-08-13 NOTE — Patient Instructions (Addendum)
It was a pleasure meeting you today. Thank you for allowing me to take part in your health care.  Our goals for today as we discussed include:  We will get some labs today.  If they are abnormal or we need to do something about them, I will call you.  If they are normal, I will send you a message on MyChart (if it is active) or a letter in the mail.  If you don't hear from [place] in 2 weeks, please call the office at the number below.   Follow up as needed   If you have any questions or concerns, please do not hesitate to call the office at [phone].  I look forward to our next visit and until then take care and stay safe.  Regards,   Dana Allan, MD   [facility]

## 2023-08-14 ENCOUNTER — Encounter: Payer: Self-pay | Admitting: Family Medicine

## 2023-08-14 LAB — VITAMIN B12: Vitamin B-12: 509 pg/mL (ref 211–911)

## 2023-08-14 LAB — VITAMIN D 25 HYDROXY (VIT D DEFICIENCY, FRACTURES): VITD: 48.32 ng/mL (ref 30.00–100.00)

## 2023-08-15 ENCOUNTER — Other Ambulatory Visit (HOSPITAL_COMMUNITY): Payer: Self-pay

## 2023-08-15 MED ORDER — CABERGOLINE 0.5 MG PO TABS
0.2500 mg | ORAL_TABLET | ORAL | 1 refills | Status: AC
Start: 2023-08-18 — End: ?
  Filled 2023-08-15 – 2024-05-26 (×2): qty 8, 56d supply, fill #0

## 2023-08-15 NOTE — Progress Notes (Unsigned)
NEUROLOGY CONSULTATION NOTE  Suzanne Evans MRN: [id number] DOB: 1981-11-24  Referring provider: Self-referral Primary care provider: Dana Allan, MD  Reason for consult:  abnormal brain MRI  Assessment/Plan:   White matter abnormalities on brain MRI.  Findings are nonspecific and mild.  Uncertain clinical significance but frequently seen in people with migraines. Evidence of possible cervical spinal stenosis found incidentally on brain MRI.  Patient does not have any symptoms or exhibit signs of myelopathy on exam.     Continue routine MRI brain follow up of pituitary. As she does not have symptoms or exhibit signs of myelopathy on exam, will not proceed with MRI of cervical spine.  If she starts experiencing symptoms, would check MRI of cervical spine Otherwise, follow up as needed.   Subjective:  Suzanne Evans is a 42 year old right-handed female who presents for abnormal findings on brain MRI.  History supplemented by primary care and endocrinology notes.  Brain MRIs personally reviewed.  Patient was diagnosed with a prolactinoma in 2020 after presenting with galactorrhea.  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  Follow up MRI on 08/26/2020 revealed stable 4 mm mciroadenoma as well as demonstrated stable punctate hyperintense focus in the left frontal lobe, now seen on FLAIR as well as T2.  The microadenoma on follow up MRI of brain w/wo from 08/25/2021 no longer visualized.  Most recent MRI of brain with and without contrast on 07/23/2023 showed very few small nonspecific T2 FLAIR hyperintensities including known left frontal lobe as well as new punctate focus within the right parietal lobe.  No evidence of adenoma seen.  New finding shows a central disc protrusion at C2-C3 effacing the ventral thecal sac and  mildly flattening the ventral aspect of spinal cord with possible moderate spinal stenosis.  She previously had some body pain which resolved with prednisone.  Currently denies extremity numbness, weakness, gait instability, urinary retention or other symptoms consistent with myelopathy.  In October 2015, she had an episode of numbness and tingling on left scalp spreading to face.  No facial droop, visual disturbance or extremity involvement.  It was determined to be related to anxiety at the time.    She has history of B12 deficiency.  She was previously found to have a positive ANA with elevated titer of 1:320.  ENA panel was negative.  She did see rheumatology who did not believe that she had an underlying autoimmune disease.  She has history of migraines.  They used to be frequent during the COVID pandemic.  They are now manageable, occurring once or twice a month.  She wears blue-light filter glasses which help.  Family History: Paternal grandmother and father have occipital neuralgia Grandmother - several TIAs.   Grandfather - temporal arteritis  PAST MEDICAL HISTORY: Past Medical History:  Diagnosis Date   Abdominal pain 12/05/2017   Abdominal pain, epigastric 03/23/2014   Abnormal Pap smear of cervix    ASCUS, +HPV   Annual physical exam 03/03/2014   Anxiety    Bronchospasm 11/18/2018   Bunion, left foot    Bunion, left foot 12/05/2017   Chicken pox    Constipation    Constipation    Constipation 12/05/2017   COVID-19    06/2021   Genital herpes    Indication for care in  labor and delivery, antepartum 06/27/2016   Labral tear of left hip joint    PRP inj with Dr. Jean Rosenthal Leb as of 2023   Left facial numbness 10/13/2014   Microadenoma    pituitary Dr. Sharl Ma   Ovarian cyst    left s/p rupture imaing 2020/2021   Pain with swallowing 03/23/2014   Pelvic pressure in pregnancy 05/02/2016   Postpartum care following vaginal delivery 06/27/2016   PTSD (post-traumatic stress  disorder)    previous marital issues (ex husband)   Routine general medical examination at a health care facility 03/03/2014    PAST SURGICAL HISTORY: Past Surgical History:  Procedure Laterality Date   APPENDECTOMY  2003   TUBAL LIGATION N/A 06/28/2016   Procedure: POST PARTUM TUBAL LIGATION;  Surgeon: Suzy Bouchard, MD;  Location: [facility] ORS;  Service: Gynecology;  Laterality: N/A;    MEDICATIONS: Current Outpatient Medications on File Prior to Visit  Medication Sig Dispense Refill   albuterol (VENTOLIN HFA) 108 (90 Base) MCG/ACT inhaler Inhale 1-2 puffs into the lungs every 6 (six) hours as needed for wheezing or shortness of breath. 6.7 g 12   amphetamine-dextroamphetamine (ADDERALL) 10 MG tablet Take 1 tablet by mouth daily at 5pm. 90 tablet 0   amphetamine-dextroamphetamine (ADDERALL) 10 MG tablet Take 1 tablet (10 mg total) by mouth daily at 5 PM. 90 tablet 0   amphetamine-dextroamphetamine (ADDERALL) 10 MG tablet Take 1 tablet (10 mg total) by mouth daily at 5pm 90 tablet 0   cabergoline (DOSTINEX) 0.5 MG tablet Take 0.5 tablets (0.25 mg total) by mouth once a week. 6 tablet 4   Cholecalciferol 1.25 MG (50000 UT) capsule Take 1 capsule (50,000 Units total) by mouth once a week. 13 capsule 1   Cyanocobalamin (VITAMIN B-12) 5000 MCG SUBL 1 tablet under the tongue and allow to dissolve Sublingual Once a day     fluticasone (FLONASE) 50 MCG/ACT nasal spray Place 2 sprays into both nostrils daily. 16 g 11   ketoconazole (NIZORAL) 2 % shampoo Apply topically 2 (two) times a week. Let sit for at least 5 minutes before rinsing. 120 mL 11   ketoconazole (NIZORAL) 2 % shampoo Shampoo with liberally as directed three times a week Lather well, let sit 2-3 minutes then rinse. 120 mL 11   lisdexamfetamine (VYVANSE) 40 MG capsule Take 1 capsule by mouth daily. 90 capsule 0   lisdexamfetamine (VYVANSE) 40 MG capsule Take 1 capsule (40 mg total) by mouth daily. 90 capsule 0   lisdexamfetamine  (VYVANSE) 40 MG capsule Take 1 capsule (40 mg total) by mouth daily. 90 capsule 0   lisdexamfetamine (VYVANSE) 40 MG capsule Take 1 capsule (40 mg total) by mouth daily. 90 capsule 0   ondansetron (ZOFRAN-ODT) 4 MG disintegrating tablet Dissolve 1 tablet (4 mg total) by mouth every 6 (six) hours as needed for nausea 30 tablet 0   sertraline (ZOLOFT) 100 MG tablet Take 1.5 tablets (150 mg total) by mouth daily with breakfast. (Patient taking differently: Take 150 mg by mouth daily with breakfast. Patient is currently taking 100 mg) 135 tablet 1   sodium chloride (OCEAN) 0.65 % SOLN nasal spray Place 1-2 sprays into both nostrils daily. 30 mL 12   triamcinolone ointment (KENALOG) 0.1 % Apply to nails and cuticles every evening before bed 30 g 3   WEGOVY 0.25 MG/0.5ML SOAJ Inject 0.25 mg into the skin once a week. 2 mL 0   No current facility-administered medications on file prior to visit.  ALLERGIES: Allergies  Allergen Reactions   Sulfa Antibiotics Anaphylaxis   Erythromycin Other (See Comments)    Unknown rxn, childhood allergy   Iron Itching    FAMILY HISTORY: Family History  Problem Relation Age of Onset   Heart disease Maternal Grandmother    Hyperlipidemia Maternal Grandmother    Stroke Maternal Grandmother    Hyperlipidemia Mother    Obesity Mother    Alcohol abuse Paternal Grandfather    Obesity Father    Obesity Sister    Crohn's disease Sister        s/p colectomy age 42 y.o   Other Sister        covid 64   Obesity Brother    Lung cancer Maternal Grandfather        smoker   Colon cancer Neg Hx    Colitis Neg Hx    Diabetes Neg Hx    Kidney disease Neg Hx    Liver disease Neg Hx     Objective:  Blood pressure 111/75, pulse (!) 105, height 5\' 5"  (1.651 m), weight 141 lb (64 kg), last menstrual period 08/01/2023, SpO2 98%. General: No acute distress.  Patient appears well-groomed.   Head:  Normocephalic/atraumatic Eyes:  fundi examined but not  visualized Neck: supple, no paraspinal tenderness, full range of motion Back: No paraspinal tenderness Heart: regular rate and rhythm Lungs: Clear to auscultation bilaterally. Vascular: No carotid bruits. Neurological Exam: Mental status: alert and oriented to person, place, and time, speech fluent and not dysarthric, language intact. Cranial nerves: CN I: not tested CN II: pupils equal, round and reactive to light, visual fields intact CN III, IV, VI:  full range of motion, no nystagmus, no ptosis CN V: facial sensation intact. CN VII: upper and lower face symmetric CN VIII: hearing intact CN IX, X: gag intact, uvula midline CN XI: sternocleidomastoid and trapezius muscles intact CN XII: tongue midline Bulk & Tone: normal, no fasciculations. Motor:  muscle strength 5/5 throughout Sensation:  Pinprick and vibratory sensation intact. Deep Tendon Reflexes:  2+ throughout,  toes downgoing.   Finger to nose testing:  Without dysmetria.   Heel to shin:  Without dysmetria.   Gait:  Normal station and stride.  Romberg negative.    Thank you for allowing me to take part in the care of this patient.  Shon Millet, DO  CC: Dana Allan, MD

## 2023-08-18 ENCOUNTER — Encounter: Payer: Self-pay | Admitting: Neurology

## 2023-08-18 ENCOUNTER — Ambulatory Visit: Payer: 59 | Admitting: Neurology

## 2023-08-18 VITALS — BP 111/75 | HR 105 | Ht 65.0 in | Wt 141.0 lb

## 2023-08-18 DIAGNOSIS — R9082 White matter disease, unspecified: Secondary | ICD-10-CM | POA: Diagnosis not present

## 2023-08-23 ENCOUNTER — Other Ambulatory Visit (HOSPITAL_COMMUNITY): Payer: Self-pay

## 2023-08-28 ENCOUNTER — Other Ambulatory Visit (HOSPITAL_COMMUNITY): Payer: Self-pay

## 2023-08-31 ENCOUNTER — Encounter: Payer: Self-pay | Admitting: Family Medicine

## 2023-08-31 DIAGNOSIS — E538 Deficiency of other specified B group vitamins: Secondary | ICD-10-CM | POA: Insufficient documentation

## 2023-08-31 DIAGNOSIS — Z0000 Encounter for general adult medical examination without abnormal findings: Secondary | ICD-10-CM | POA: Insufficient documentation

## 2023-08-31 DIAGNOSIS — Z1231 Encounter for screening mammogram for malignant neoplasm of breast: Secondary | ICD-10-CM | POA: Insufficient documentation

## 2023-08-31 NOTE — Assessment & Plan Note (Signed)
Mammogram referral sent Recommend regular self breast exams PAP due, follows with OBGYN A1c, Lipid screening at next visit Normotensive PHQ9/GAD scores low, follows with psychiatry Colonoscopy at age 40-50

## 2023-08-31 NOTE — Assessment & Plan Note (Signed)
Check Vitamin D level 

## 2023-08-31 NOTE — Assessment & Plan Note (Signed)
Check B12 level. 

## 2023-09-02 ENCOUNTER — Other Ambulatory Visit (HOSPITAL_COMMUNITY): Payer: Self-pay

## 2023-09-02 ENCOUNTER — Telehealth: Payer: 59 | Admitting: Family Medicine

## 2023-09-02 DIAGNOSIS — H10022 Other mucopurulent conjunctivitis, left eye: Secondary | ICD-10-CM

## 2023-09-02 MED ORDER — POLYMYXIN B-TRIMETHOPRIM 10000-0.1 UNIT/ML-% OP SOLN
1.0000 [drp] | Freq: Four times a day (QID) | OPHTHALMIC | 0 refills | Status: AC
Start: 2023-09-02 — End: ?
  Filled 2023-09-02: qty 10, 5d supply, fill #0

## 2023-09-02 NOTE — Progress Notes (Signed)
E-Visit for Pink Eye ? ? ?We are sorry that you are not feeling well.  Here is how we plan to help! ? ?Based on what you have shared with me it looks like you have conjunctivitis.  Conjunctivitis is a common inflammatory or infectious condition of the eye that is often referred to as "pink eye".  In most cases it is contagious (viral or bacterial). However, not all conjunctivitis requires antibiotics (ex. Allergic).  We have made appropriate suggestions for you based upon your presentation. ? ?I have prescribed Polytrim Ophthalmic drops 1-2 drops 4 times a day times 5 days ? ?Pink eye can be highly contagious.  It is typically spread through direct contact with secretions, or contaminated objects or surfaces that one may have touched.  Strict handwashing is suggested with soap and water is urged.  If not available, use alcohol based had sanitizer.  Avoid unnecessary touching of the eye.  If you wear contact lenses, you will need to refrain from wearing them until you see no white discharge from the eye for at least 24 hours after being on medication.  You should see symptom improvement in 1-2 days after starting the medication regimen.  Call us if symptoms are not improved in 1-2 days. ? ?Home Care: ?Wash your hands often! ?Do not wear your contacts until you complete your treatment plan. ?Avoid sharing towels, bed linen, personal items with a person who has pink eye. ?See attention for anyone in your home with similar symptoms. ? ?Get Help Right Away If: ?Your symptoms do not improve. ?You develop blurred or loss of vision. ?Your symptoms worsen (increased discharge, pain or redness) ? ? ?Thank you for choosing an e-visit. ? ?Your e-visit answers were reviewed by a board certified advanced clinical practitioner to complete your personal care plan. Depending upon the condition, your plan could have included both over the counter or prescription medications. ? ?Please review your pharmacy choice. Make sure the  pharmacy is open so you can pick up prescription now. If there is a problem, you may contact your provider through MyChart messaging and have the prescription routed to another pharmacy.  Your safety is important to us. If you have drug allergies check your prescription carefully.  ? ?For the next 24 hours you can use MyChart to ask questions about today's visit, request a non-urgent call back, or ask for a work or school excuse. ?You will get an email in the next two days asking about your experience. I hope that your e-visit has been valuable and will speed your recovery. ? ?I provided 5 minutes of non face-to-face time during this encounter for chart review, medication and order placement, as well as and documentation.  ? ?

## 2023-09-04 ENCOUNTER — Encounter: Payer: Self-pay | Admitting: Family Medicine

## 2023-09-04 MED ORDER — OCUFLOX 0.3 % OP SOLN: 1.0000 [drp] | Freq: Four times a day (QID) | OPHTHALMIC | 0 refills | Status: AC

## 2023-09-10 DIAGNOSIS — D352 Benign neoplasm of pituitary gland: Secondary | ICD-10-CM | POA: Diagnosis not present

## 2023-10-02 DIAGNOSIS — Z01419 Encounter for gynecological examination (general) (routine) without abnormal findings: Secondary | ICD-10-CM | POA: Diagnosis not present

## 2023-10-02 DIAGNOSIS — Z1231 Encounter for screening mammogram for malignant neoplasm of breast: Secondary | ICD-10-CM | POA: Diagnosis not present

## 2023-10-02 DIAGNOSIS — Z1331 Encounter for screening for depression: Secondary | ICD-10-CM | POA: Diagnosis not present

## 2023-10-02 LAB — HM MAMMOGRAPHY

## 2023-10-06 ENCOUNTER — Encounter: Payer: Self-pay | Admitting: Obstetrics and Gynecology

## 2023-10-09 ENCOUNTER — Other Ambulatory Visit: Payer: Self-pay | Admitting: Medical Genetics

## 2023-10-09 DIAGNOSIS — Z006 Encounter for examination for normal comparison and control in clinical research program: Secondary | ICD-10-CM

## 2023-10-20 DIAGNOSIS — F900 Attention-deficit hyperactivity disorder, predominantly inattentive type: Secondary | ICD-10-CM | POA: Diagnosis not present

## 2023-10-20 DIAGNOSIS — F411 Generalized anxiety disorder: Secondary | ICD-10-CM | POA: Diagnosis not present

## 2023-10-21 ENCOUNTER — Other Ambulatory Visit (HOSPITAL_COMMUNITY): Payer: Self-pay

## 2023-10-21 MED ORDER — LISDEXAMFETAMINE DIMESYLATE 40 MG PO CAPS
40.0000 mg | ORAL_CAPSULE | Freq: Every day | ORAL | 0 refills | Status: DC
  Filled 2023-10-21 – 2023-12-05 (×2): qty 90, 90d supply, fill #0

## 2023-11-06 NOTE — Progress Notes (Signed)
[name] D.[facility] [address] [place] [address] Phone: [phone]   Assessment and Plan:     1. Chronic left hip pain 2. Tear of left acetabular labrum, subsequent encounter -Chronic with exacerbation, subsequent visit - Consistent with recurrent flare of labral pathology with MRI on 06/17/2022 showing mild partial-thickness cartilage loss and left labral degeneration with left anterior labral tear - Patient had significant relief with intra-articular PRP injection performed on 06/21/2022 with >1-year relief.  Patient had no significant relief with Tylenol, NSAID, intra-articular CSI treatments.  Elects for repeat PRP injection.  Can follow-up next week for procedure - Plan to start 6 weeks of physical therapy after PRP injection.  PT referral placed today   Follow Up: Next week for intra-articular left hip PRP injection   Subjective:   I, Suzanne Evans, am serving as a Neurosurgeon for Doctor Richardean Sale   Chief Complaint: left hip pain    HPI:  04/18/2022 Patient is a 42 year old female complaining of left hip pain. Patient states she was doing a boot camp 1-2 years ago and felt pain in her anterior hip, has taken aleve , has given boot camps a rest , any time she walks more than 5000 time a day it bothers her , now she does kick boxing, it hurts to lay down butterfly kicks or anything that involves being on the ground and hip motions, does not get numbness or tingling as of recent took some prednisone and it went away, pain stays in the hip and doesn't radiate, jogging hurts, does get popping bilaterally not painful, it actually relieves some of the pain but not long term   05/09/2022 Patient states that hip is slightly improved went to [employer] and walked a lot  and was in pain , had COVID and was resting so no pain then , feels more like a dull toothache now still constant    06/21/2022 Patient states that she is doing the same     07/11/2022 Patient states is okay sometimes she will get an ache down her leg but not a strong pain ,more like a tooth ache,  only happens on occasions when she is on her feet for a long time    11/07/2023 Patient states that her hip is bothering her again   Relevant Historical Information: None pertinent  Additional pertinent review of systems negative.   Current Outpatient Medications:    albuterol (VENTOLIN HFA) 108 (90 Base) MCG/ACT inhaler, Inhale 1-2 puffs into the lungs every 6 (six) hours as needed for wheezing or shortness of breath., Disp: 6.7 g, Rfl: 12   amphetamine-dextroamphetamine (ADDERALL) 10 MG tablet, Take 1 tablet (10 mg total) by mouth daily at 5pm, Disp: 90 tablet, Rfl: 0   cabergoline (DOSTINEX) 0.5 MG tablet, Take 0.5 tablets (0.25 mg total) by mouth 2 (two) times a week., Disp: 4 tablet, Rfl: 1   cholecalciferol (VITAMIN D3) 25 MCG (1000 UNIT) tablet, Take 1,000 Units by mouth daily., Disp: , Rfl:    Cyanocobalamin (VITAMIN B-12) 5000 MCG SUBL, 1 tablet under the tongue and allow to dissolve Sublingual Once a day, Disp: , Rfl:    fluticasone (FLONASE) 50 MCG/ACT nasal spray, Place 2 sprays into both nostrils daily., Disp: 16 g, Rfl: 11   ketoconazole (NIZORAL) 2 % shampoo, Apply topically 2 (two) times a week. Let sit for at least 5 minutes before rinsing., Disp: 120 mL, Rfl: 11   ketoconazole (NIZORAL)  2 % shampoo, Shampoo with liberally as directed three times a week Lather well, let sit 2-3 minutes then rinse., Disp: 120 mL, Rfl: 11   lisdexamfetamine (VYVANSE) 40 MG capsule, Take 1 capsule by mouth daily., Disp: 90 capsule, Rfl: 0   lisdexamfetamine (VYVANSE) 40 MG capsule, Take 1 capsule (40 mg total) by mouth daily., Disp: 90 capsule, Rfl: 0   lisdexamfetamine (VYVANSE) 40 MG capsule, Take 1 capsule (40 mg total) by mouth daily., Disp: 90 capsule, Rfl: 0   lisdexamfetamine (VYVANSE) 40 MG capsule, Take 1 capsule (40 mg total) by mouth daily., Disp: 90 capsule,  Rfl: 0   sodium chloride (OCEAN) 0.65 % SOLN nasal spray, Place 1-2 sprays into both nostrils daily., Disp: 30 mL, Rfl: 12   triamcinolone ointment (KENALOG) 0.1 %, Apply to nails and cuticles every evening before bed, Disp: 30 g, Rfl: 3   cabergoline (DOSTINEX) 0.5 MG tablet, Take 0.5 tablets (0.25 mg total) by mouth once a week., Disp: 6 tablet, Rfl: 4   ondansetron (ZOFRAN-ODT) 4 MG disintegrating tablet, Dissolve 1 tablet (4 mg total) by mouth every 6 (six) hours as needed for nausea, Disp: 30 tablet, Rfl: 0   trimethoprim-polymyxin b (POLYTRIM) ophthalmic solution, Place 1 drop into the right eye in the morning, at noon, in the evening, and at bedtime. For 5 days, Disp: 10 mL, Rfl: 0   Objective:     Vitals:   11/07/23 0914  BP: 118/78  Pulse: 77  SpO2: 99%  Weight: 148 lb (67.1 kg)  Height: 5\' 5"  (1.651 m)      Body mass index is 24.63 kg/m.    Physical Exam:    General: awake, alert, and oriented no acute distress, nontoxic Skin: no suspicious lesions or rashes Neuro:sensation intact distally with no dificits, normal muscle tone, no atrophy, strength 5/5 in all tested lower ext groups Psych: normal mood and affect, speech clear   Left hip: No deformity, swelling or wasting ROM Flexion 90 (causes pain), ext 30, IR 45, ER 45 NTTP over the hip flexors, greater troch, glute musculature, si joint, lumbar spine Negative log roll with FROM Positive FABER for mild groin pain Positive FADIR for moderate groin pain Negative Piriformis test Negative trendelenberg Gait normal     Electronically signed by:  [name] [facility] 9:24 AM 11/07/23

## 2023-11-07 ENCOUNTER — Ambulatory Visit: Payer: 59 | Admitting: Sports Medicine

## 2023-11-07 VITALS — BP 118/78 | HR 77 | Ht 65.0 in | Wt 148.0 lb

## 2023-11-07 DIAGNOSIS — S73192D Other sprain of left hip, subsequent encounter: Secondary | ICD-10-CM | POA: Diagnosis not present

## 2023-11-07 DIAGNOSIS — M25552 Pain in left hip: Secondary | ICD-10-CM | POA: Diagnosis not present

## 2023-11-07 DIAGNOSIS — G8929 Other chronic pain: Secondary | ICD-10-CM | POA: Diagnosis not present

## 2023-11-10 ENCOUNTER — Ambulatory Visit (INDEPENDENT_AMBULATORY_CARE_PROVIDER_SITE_OTHER): Payer: Self-pay | Admitting: Sports Medicine

## 2023-11-10 ENCOUNTER — Other Ambulatory Visit: Payer: Self-pay

## 2023-11-10 DIAGNOSIS — S73192D Other sprain of left hip, subsequent encounter: Secondary | ICD-10-CM

## 2023-11-10 DIAGNOSIS — G8929 Other chronic pain: Secondary | ICD-10-CM

## 2023-11-10 DIAGNOSIS — M25552 Pain in left hip: Secondary | ICD-10-CM

## 2023-11-10 NOTE — Progress Notes (Signed)
Suzanne Evans D.[facility] [address] [place] [address] Phone: [phone]   Assessment and Plan:     1. Chronic left hip pain 2. Tear of left acetabular labrum, subsequent encounter  -Chronic with exacerbation, subsequent visit - Consistent with recurrent flare of labral pathology with MRI on 06/17/2022 showing mild partial-thickness cartilage loss and left labral degeneration with left anterior labral tear - Patient previously had significant relief after intra-articular PRP injection performed on 06/21/2022 and elects for repeat PRP injection today.  Tolerated well per note below - Continue HEP and start physical therapy  Procedure: Ultrasound Guided Hip Acetabulofemoral Joint Injection Side: Left Diagnosis: Left hip pain with labral tear and cartilage loss [place] Indication:  - accuracy is paramount for diagnosis - to ensure therapeutic efficacy or procedural success - to reduce procedural risk  After explaining the procedure, viable alternatives, risks, and answering any questions, consent was given verbally. The site was cleaned with chlorhexidine prep. An ultrasound transducer was placed on the anterior thigh/hip.   The acetabular joint, labrum, and femoral shaft were identified.  The neurovascular structures were identified and an approach was found specifically avoiding these structures.  A steroid injection was performed under ultrasound guidance with sterile technique using 2ml of 1% lidocaine without epinephrine and 5.60mL of PRP. This was well tolerated and resulted in  relief.  Needle was removed and dressing placed and post injection instructions were given including  a discussion of likely return of pain today after the anesthetic wears off (with the possibility of worsened pain) until the steroid starts to work in 1-3 days.   Pt was advised to call or return to clinic if these symptoms worsen or fail to improve as  anticipated.   Pertinent previous records reviewed include none  Follow Up: As needed if no improvement or worsening of symptoms   Subjective:   I, Suzanne Evans, am serving as a Neurosurgeon for Doctor Richardean Sale   Chief Complaint: left hip pain    HPI:  04/18/2022 Patient is a 42 year old female complaining of left hip pain. Patient states she was doing a boot camp 1-2 years ago and felt pain in her anterior hip, has taken aleve , has given boot camps a rest , any time she walks more than 5000 time a day it bothers her , now she does kick boxing, it hurts to lay down butterfly kicks or anything that involves being on the ground and hip motions, does not get numbness or tingling as of recent took some prednisone and it went away, pain stays in the hip and doesn't radiate, jogging hurts, does get popping bilaterally not painful, it actually relieves some of the pain but not long term   05/09/2022 Patient states that hip is slightly improved went to [employer] and walked a lot  and was in pain , had COVID and was resting so no pain then , feels more like a dull toothache now still constant    06/21/2022 Patient states that she is doing the same    07/11/2022 Patient states is okay sometimes she will get an ache down her leg but not a strong pain ,more like a tooth ache,  only happens on occasions when she is on her feet for a long time    11/07/2023 Patient states that her hip is bothering her again   11/10/2023 Patient presents for PRP injection, procedure only visit.  Relevant Historical Information: None pertinent  Additional pertinent review of systems negative.   Current Outpatient Medications:    albuterol (VENTOLIN HFA) 108 (90 Base) MCG/ACT inhaler, Inhale 1-2 puffs into the lungs every 6 (six) hours as needed for wheezing or shortness of breath., Disp: 6.7 g, Rfl: 12   amphetamine-dextroamphetamine (ADDERALL) 10 MG tablet, Take 1 tablet (10 mg total) by mouth daily at 5pm,  Disp: 90 tablet, Rfl: 0   cabergoline (DOSTINEX) 0.5 MG tablet, Take 0.5 tablets (0.25 mg total) by mouth once a week., Disp: 6 tablet, Rfl: 4   cabergoline (DOSTINEX) 0.5 MG tablet, Take 0.5 tablets (0.25 mg total) by mouth 2 (two) times a week., Disp: 4 tablet, Rfl: 1   cholecalciferol (VITAMIN D3) 25 MCG (1000 UNIT) tablet, Take 1,000 Units by mouth daily., Disp: , Rfl:    Cyanocobalamin (VITAMIN B-12) 5000 MCG SUBL, 1 tablet under the tongue and allow to dissolve Sublingual Once a day, Disp: , Rfl:    fluticasone (FLONASE) 50 MCG/ACT nasal spray, Place 2 sprays into both nostrils daily., Disp: 16 g, Rfl: 11   ketoconazole (NIZORAL) 2 % shampoo, Apply topically 2 (two) times a week. Let sit for at least 5 minutes before rinsing., Disp: 120 mL, Rfl: 11   ketoconazole (NIZORAL) 2 % shampoo, Shampoo with liberally as directed three times a week Lather well, let sit 2-3 minutes then rinse., Disp: 120 mL, Rfl: 11   lisdexamfetamine (VYVANSE) 40 MG capsule, Take 1 capsule by mouth daily., Disp: 90 capsule, Rfl: 0   lisdexamfetamine (VYVANSE) 40 MG capsule, Take 1 capsule (40 mg total) by mouth daily., Disp: 90 capsule, Rfl: 0   lisdexamfetamine (VYVANSE) 40 MG capsule, Take 1 capsule (40 mg total) by mouth daily., Disp: 90 capsule, Rfl: 0   lisdexamfetamine (VYVANSE) 40 MG capsule, Take 1 capsule (40 mg total) by mouth daily., Disp: 90 capsule, Rfl: 0   ondansetron (ZOFRAN-ODT) 4 MG disintegrating tablet, Dissolve 1 tablet (4 mg total) by mouth every 6 (six) hours as needed for nausea, Disp: 30 tablet, Rfl: 0   sodium chloride (OCEAN) 0.65 % SOLN nasal spray, Place 1-2 sprays into both nostrils daily., Disp: 30 mL, Rfl: 12   triamcinolone ointment (KENALOG) 0.1 %, Apply to nails and cuticles every evening before bed, Disp: 30 g, Rfl: 3   trimethoprim-polymyxin b (POLYTRIM) ophthalmic solution, Place 1 drop into the right eye in the morning, at noon, in the evening, and at bedtime. For 5 days, Disp: 10  mL, Rfl: 0      Electronically signed by:  Suzanne Evans [facility] 3:52 PM 11/10/23

## 2023-11-14 NOTE — Therapy (Deleted)
OUTPATIENT PHYSICAL THERAPY LOWER EXTREMITY EVALUATION   Patient Name: Suzanne Evans MRN: [id number] DOB:[birth year], 42 y.o., female Today's Date: 11/14/2023  END OF SESSION:   Past Medical History:  Diagnosis Date   Abdominal pain 12/05/2017   Abdominal pain, epigastric 03/23/2014   Abnormal Pap smear of cervix    ASCUS, +HPV   Annual physical exam 03/03/2014   Anxiety    Bronchospasm 11/18/2018   Bunion, left foot    Bunion, left foot 12/05/2017   Chicken pox    Constipation    Constipation    Constipation 12/05/2017   COVID-19    06/2021   Genital herpes    Indication for care in labor and delivery, antepartum 06/27/2016   Labral tear of left hip joint    PRP inj with Dr. Jean Rosenthal Leb as of 2023   Left facial numbness 10/13/2014   Microadenoma    pituitary Dr. Sharl Ma   Ovarian cyst    left s/p rupture imaing 2020/2021   Pain with swallowing 03/23/2014   Pelvic pressure in pregnancy 05/02/2016   Postpartum care following vaginal delivery 06/27/2016   PTSD (post-traumatic stress disorder)    previous marital issues (ex husband)   Routine general medical examination at a health care facility 03/03/2014   Past Surgical History:  Procedure Laterality Date   APPENDECTOMY  2003   TUBAL LIGATION N/A 06/28/2016   Procedure: POST PARTUM TUBAL LIGATION;  Surgeon: Suzy Bouchard, MD;  Location: [facility] ORS;  Service: Gynecology;  Laterality: N/A;   Patient Active Problem List   Diagnosis Date Noted   Vitamin B 12 deficiency 08/31/2023   Breast cancer screening by mammogram 08/31/2023   Annual physical exam 08/31/2023   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   ANA positive 09/02/2022   Vitamin D deficiency 08/01/2022   BMI 26.0-26.9,adult 10/15/2020   Microadenoma 09/03/2019   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   Galactorrhea 08/26/2019   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   ADHD 08/24/2018   Anxiety 07/17/2018   Atypical squamous cells of undetermined significance on cytologic  smear of cervix (ASC-US) 12/05/2017   Situational depression 03/03/2014   Genital herpes 02/06/2014   Abnormal uterine bleeding 06/06/2013   CIN I (cervical intraepithelial neoplasia I) 05/04/2013   Papanicolaou smear of cervix with low grade squamous intraepithelial lesion (LGSIL) 04/07/2013    PCP: Dana Allan, MD  REFERRING PROVIDER: Richardean Sale, DO  REFERRING DIAG: [phone] (ICD-10-CM) - Chronic left hip pain S73.192D (ICD-10-CM) - Tear of left acetabular labrum, subsequent encounte  THERAPY DIAG:  No diagnosis found.  Rationale for Evaluation and Treatment: Rehabilitation  ONSET DATE: ***  SUBJECTIVE:   SUBJECTIVE STATEMENT: ***  Patient is a 42 year old female complaining of left hip pain. Patient states she was doing a boot camp 1-2 years ago and felt pain in her anterior hip, has taken aleve , has given boot camps a rest , any time she walks more than 5000 time a day it bothers her , now she does kick boxing, it hurts to lay down butterfly kicks or anything that involves being on the ground and hip motions, does not get numbness or tingling as of recent took some prednisone and it went away, pain stays in the hip and doesn't radiate, jogging hurts, does get popping bilaterally not painful, it actually relieves some of the pain but not long term   Consistent with recurrent flare of labral pathology with MRI on 06/17/2022 showing mild partial-thickness cartilage loss and left labral degeneration with left anterior  labral tear - Patient previously had significant relief after intra-articular PRP injection performed on 06/21/2022 and elects for repeat PRP injection today.  Tolerated well per note below  PERTINENT HISTORY: ***PRP injections into left hip, tubal ligation PAIN:  Are you having pain? Yes: NPRS scale: ***/10 Pain location: *** Pain description: *** Aggravating factors: *** Relieving factors: ***  PRECAUTIONS: {Therapy precautions:24002}  RED  FLAGS: {PT Red Flags:29287}   WEIGHT BEARING RESTRICTIONS: {Yes ***/No:24003}  FALLS:  Has patient fallen in last 6 months? {fallsyesno:27318}  LIVING ENVIRONMENT: Lives with: {OPRC lives with:25569::"lives with their family"} Lives in: {Lives in:25570} Stairs: {opstairs:27293} Has following equipment at home: {Assistive devices:23999}  OCCUPATION: ***  PLOF: {PLOF:24004}  PATIENT GOALS: ***  NEXT MD VISIT: ***  OBJECTIVE:  Note: Objective measures were completed at Evaluation unless otherwise noted.  DIAGNOSTIC FINDINGS:  MRI LEFT HIP 06/18/22 IMPRESSION: 1. Left labral degeneration with a left anterior labral tear. 2. Mild partial-thickness cartilage loss the left femoral head and acetabulum.  PATIENT SURVEYS:  {rehab surveys:24030}  COGNITION: Overall cognitive status: {cognition:24006}     SENSATION: {sensation:27233}  EDEMA:  {edema:24020}  MUSCLE LENGTH: Hamstrings: Right *** deg; Left *** deg Maisie Fus test: Right *** deg; Left *** deg  POSTURE: {posture:25561}  PALPATION: ***    LE Measurements Lower Extremity Right EVAL Left EVAL   A/PROM MMT A/PROM MMT  Hip Flexion      Hip Extension      Hip Abduction      Hip Adduction      Hip Internal rotation      Hip External rotation      Knee Flexion      Knee Extension      Ankle Dorsiflexion      Ankle Plantarflexion      Ankle Inversion      Ankle Eversion       (Blank rows = not tested) * pain   LOWER EXTREMITY SPECIAL TESTS:  {LEspecialtests:26242}  FUNCTIONAL TESTS:  {Functional tests:24029}  GAIT: Distance walked: *** Assistive device utilized: {Assistive devices:23999} Level of assistance: {Levels of assistance:24026} Comments: ***   TODAY'S TREATMENT:                                                                                                                              DATE: ***  11/14/2023  Therapeutic  Exercise:  Aerobic: Supine: Prone:  Seated:  Standing: Neuromuscular Re-education: Manual Therapy: Therapeutic Activity: Self Care: Trigger Point Dry Needling:  Modalities:    PATIENT EDUCATION:  Education details: on current presentation, on HEP, on clinical outcomes score and POC Person educated: Patient Education method: Explanation, Demonstration, and Handouts Education comprehension: verbalized understanding   HOME EXERCISE PROGRAM: ***  ASSESSMENT:  CLINICAL IMPRESSION: Patient is a *** y.o. *** who was seen today for physical therapy evaluation and treatment for ***.   OBJECTIVE IMPAIRMENTS: {opptimpairments:25111}.   ACTIVITY LIMITATIONS: {activitylimitations:27494}  PARTICIPATION LIMITATIONS: {participationrestrictions:25113}  PERSONAL FACTORS: {Personal factors:25162}  are also affecting patient's functional outcome.   REHAB POTENTIAL: {rehabpotential:25112}  CLINICAL DECISION MAKING: {clinical decision making:25114}  EVALUATION COMPLEXITY: {Evaluation complexity:25115}   GOALS: Goals reviewed with patient? yes  SHORT TERM GOALS: Target date: {follow up:25551} *** MAKE TEXT EDITABLE Patient will be independent in self management strategies to improve quality of life and functional outcomes. Baseline: New Program Goal status: INITIAL  2.  Patient will report at least 50% improvement in overall symptoms and/or function to demonstrate improved functional mobility Baseline: 0% better Goal status: INITIAL  3.  *** Baseline:  Goal status: INITIAL  4.  *** Baseline:  Goal status: INITIAL    LONG TERM GOALS: Target date: {follow up:25551} *** MAKE TEXT EDITABLE  Patient will report at least 75% improvement in overall symptoms and/or function to demonstrate improved functional mobility Baseline: 0% better Goal status: INITIAL  2.  Patient will improve score on FOTO outcomes measure to projected score to demonstrate overall improved function and  QOL Baseline: see above Goal status: INITIAL  3.  *** Baseline:  Goal status: INITIAL  4.  *** Baseline:  Goal status: INITIAL    PLAN:  PT FREQUENCY: {rehab frequency:25116}  PT DURATION: {rehab duration:25117}  PLANNED INTERVENTIONS: 97110-Therapeutic exercises, 97530- Therapeutic activity, 97112- Neuromuscular re-education, 97535- Self Care, 27253- Manual therapy, [id number]- Gait training, [phone]- Orthotic Fit/training, [phone]- Canalith repositioning, [id number]- Aquatic Therapy, 97014- Electrical stimulation (unattended), [phone]- Ionotophoresis 4mg /ml Dexamethasone, Patient/Family education, Balance training, Stair training, Taping, Dry Needling, Joint mobilization, Joint manipulation, Spinal manipulation, Spinal mobilization, Cryotherapy, and Moist heat   PLAN FOR NEXT SESSION: ***   12:50 PM, 11/14/23 Tereasa Coop, DPT Physical Therapy with Dolores Lory

## 2023-11-17 ENCOUNTER — Ambulatory Visit: Payer: 59 | Admitting: Physical Therapy

## 2023-11-26 ENCOUNTER — Other Ambulatory Visit: Payer: Self-pay

## 2023-11-26 ENCOUNTER — Other Ambulatory Visit (HOSPITAL_COMMUNITY): Payer: Self-pay

## 2023-12-05 ENCOUNTER — Other Ambulatory Visit (HOSPITAL_COMMUNITY): Payer: Self-pay

## 2024-01-19 ENCOUNTER — Other Ambulatory Visit (HOSPITAL_COMMUNITY): Payer: Self-pay

## 2024-01-19 DIAGNOSIS — F900 Attention-deficit hyperactivity disorder, predominantly inattentive type: Secondary | ICD-10-CM | POA: Diagnosis not present

## 2024-01-19 DIAGNOSIS — F411 Generalized anxiety disorder: Secondary | ICD-10-CM | POA: Diagnosis not present

## 2024-01-19 MED ORDER — LISDEXAMFETAMINE DIMESYLATE 40 MG PO CAPS
40.0000 mg | ORAL_CAPSULE | Freq: Every day | ORAL | 0 refills | Status: DC
  Filled 2024-01-19 – 2024-03-24 (×2): qty 90, 90d supply, fill #0

## 2024-01-19 MED ORDER — AMPHETAMINE-DEXTROAMPHETAMINE 10 MG PO TABS
10.0000 mg | ORAL_TABLET | Freq: Every day | ORAL | 0 refills | Status: AC
Start: 2024-01-19 — End: ?
  Filled 2024-01-19: qty 90, 90d supply, fill #0

## 2024-01-30 ENCOUNTER — Other Ambulatory Visit (HOSPITAL_COMMUNITY): Payer: Self-pay

## 2024-02-14 ENCOUNTER — Telehealth: Payer: Commercial Managed Care - PPO | Admitting: Physician Assistant

## 2024-02-14 DIAGNOSIS — J329 Chronic sinusitis, unspecified: Secondary | ICD-10-CM | POA: Diagnosis not present

## 2024-02-14 MED ORDER — AMOXICILLIN-POT CLAVULANATE 875-125 MG PO TABS
1.0000 | ORAL_TABLET | Freq: Two times a day (BID) | ORAL | 0 refills | Status: AC
Start: 2024-02-14 — End: 2024-02-21

## 2024-02-14 NOTE — Progress Notes (Signed)
 I have spent 5 minutes in review of e-visit questionnaire, review and updating patient chart, medical decision making and response to patient.   Laure Kidney, PA-C

## 2024-02-14 NOTE — Progress Notes (Signed)

## 2024-03-24 ENCOUNTER — Other Ambulatory Visit (HOSPITAL_COMMUNITY): Payer: Self-pay

## 2024-04-15 ENCOUNTER — Other Ambulatory Visit (HOSPITAL_COMMUNITY): Payer: Self-pay

## 2024-04-15 DIAGNOSIS — F900 Attention-deficit hyperactivity disorder, predominantly inattentive type: Secondary | ICD-10-CM | POA: Diagnosis not present

## 2024-04-15 DIAGNOSIS — F411 Generalized anxiety disorder: Secondary | ICD-10-CM | POA: Diagnosis not present

## 2024-04-15 MED ORDER — AMPHETAMINE-DEXTROAMPHETAMINE 10 MG PO TABS
ORAL_TABLET | ORAL | 0 refills | Status: AC
  Filled 2024-04-15: qty 90, 90d supply, fill #0

## 2024-04-15 MED ORDER — LISDEXAMFETAMINE DIMESYLATE 40 MG PO CAPS
40.0000 mg | ORAL_CAPSULE | Freq: Every day | ORAL | 0 refills | Status: AC
  Filled 2024-07-07: qty 90, 90d supply, fill #0

## 2024-04-26 ENCOUNTER — Other Ambulatory Visit (HOSPITAL_COMMUNITY): Payer: Self-pay

## 2024-05-03 DIAGNOSIS — L603 Nail dystrophy: Secondary | ICD-10-CM | POA: Diagnosis not present

## 2024-05-26 ENCOUNTER — Other Ambulatory Visit (HOSPITAL_COMMUNITY): Payer: Self-pay

## 2024-07-01 ENCOUNTER — Encounter: Payer: Self-pay | Admitting: Infectious Diseases

## 2024-07-01 MED ORDER — NITROFURANTOIN MONOHYD MACRO 100 MG PO CAPS
100.0000 mg | ORAL_CAPSULE | Freq: Two times a day (BID) | ORAL | 0 refills | Status: AC
Start: 2024-07-01 — End: 2024-07-06

## 2024-07-07 ENCOUNTER — Other Ambulatory Visit (HOSPITAL_COMMUNITY): Payer: Self-pay

## 2024-07-07 DIAGNOSIS — F411 Generalized anxiety disorder: Secondary | ICD-10-CM | POA: Diagnosis not present

## 2024-07-07 DIAGNOSIS — F900 Attention-deficit hyperactivity disorder, predominantly inattentive type: Secondary | ICD-10-CM | POA: Diagnosis not present

## 2024-07-07 MED ORDER — BUSPIRONE HCL 10 MG PO TABS
10.0000 mg | ORAL_TABLET | Freq: Every day | ORAL | 0 refills | Status: AC
  Filled 2024-07-07: qty 90, 90d supply, fill #0

## 2024-07-07 MED ORDER — AMPHETAMINE-DEXTROAMPHETAMINE 10 MG PO TABS
10.0000 mg | ORAL_TABLET | Freq: Every day | ORAL | 0 refills | Status: AC
  Filled 2024-07-07: qty 90, 90d supply, fill #0

## 2024-07-07 MED ORDER — LISDEXAMFETAMINE DIMESYLATE 40 MG PO CAPS
40.0000 mg | ORAL_CAPSULE | Freq: Every day | ORAL | 0 refills | Status: AC
  Filled 2024-07-07 – 2024-10-15 (×2): qty 90, 90d supply, fill #0

## 2024-07-07 MED ORDER — BUSPIRONE HCL 10 MG PO TABS: 10.0000 mg | ORAL_TABLET | Freq: Every day | ORAL | 0 refills | Status: AC

## 2024-07-08 ENCOUNTER — Other Ambulatory Visit (HOSPITAL_COMMUNITY): Payer: Self-pay

## 2024-07-08 ENCOUNTER — Other Ambulatory Visit: Payer: Self-pay

## 2024-07-08 MED ORDER — FLUCONAZOLE 150 MG PO TABS
150.0000 mg | ORAL_TABLET | ORAL | 0 refills | Status: AC
  Filled 2024-07-08: qty 3, 3d supply, fill #0

## 2024-08-11 DIAGNOSIS — O926 Galactorrhea: Secondary | ICD-10-CM | POA: Diagnosis not present

## 2024-08-11 DIAGNOSIS — E221 Hyperprolactinemia: Secondary | ICD-10-CM | POA: Diagnosis not present

## 2024-08-11 DIAGNOSIS — D352 Benign neoplasm of pituitary gland: Secondary | ICD-10-CM | POA: Diagnosis not present

## 2024-08-17 ENCOUNTER — Other Ambulatory Visit (HOSPITAL_COMMUNITY): Payer: Self-pay

## 2024-08-17 ENCOUNTER — Encounter: Payer: 59 | Admitting: Family Medicine

## 2024-08-17 DIAGNOSIS — Z8349 Family history of other endocrine, nutritional and metabolic diseases: Secondary | ICD-10-CM | POA: Diagnosis not present

## 2024-08-17 DIAGNOSIS — E221 Hyperprolactinemia: Secondary | ICD-10-CM | POA: Diagnosis not present

## 2024-08-17 DIAGNOSIS — D352 Benign neoplasm of pituitary gland: Secondary | ICD-10-CM | POA: Diagnosis not present

## 2024-08-17 DIAGNOSIS — R768 Other specified abnormal immunological findings in serum: Secondary | ICD-10-CM | POA: Diagnosis not present

## 2024-08-17 MED ORDER — CABERGOLINE 0.5 MG PO TABS
0.2500 mg | ORAL_TABLET | ORAL | 4 refills | Status: AC
  Filled 2024-08-17: qty 8, 56d supply, fill #0
  Filled 2024-10-15: qty 8, 56d supply, fill #1
  Filled 2024-12-28 – 2025-01-14 (×3): qty 8, 56d supply, fill #2

## 2024-09-14 ENCOUNTER — Other Ambulatory Visit (HOSPITAL_COMMUNITY): Payer: Self-pay

## 2024-09-14 ENCOUNTER — Other Ambulatory Visit: Payer: Self-pay

## 2024-09-14 ENCOUNTER — Other Ambulatory Visit: Payer: Self-pay | Admitting: Infectious Diseases

## 2024-09-14 MED ORDER — CLINDAMYCIN PHOS (TWICE-DAILY) 1 % EX GEL
Freq: Two times a day (BID) | CUTANEOUS | 0 refills | Status: AC
  Filled 2024-09-14: qty 60, 30d supply, fill #0

## 2024-09-20 ENCOUNTER — Other Ambulatory Visit: Payer: Self-pay | Admitting: Infectious Diseases

## 2024-09-20 ENCOUNTER — Other Ambulatory Visit (HOSPITAL_COMMUNITY): Payer: Self-pay

## 2024-09-20 MED ORDER — DOXYCYCLINE HYCLATE 100 MG PO TABS
100.0000 mg | ORAL_TABLET | Freq: Two times a day (BID) | ORAL | 0 refills | Status: DC
  Filled 2024-09-20: qty 28, 14d supply, fill #0

## 2024-09-24 ENCOUNTER — Encounter: Payer: Self-pay | Admitting: Infectious Diseases

## 2024-09-24 ENCOUNTER — Other Ambulatory Visit: Payer: Self-pay

## 2024-09-24 ENCOUNTER — Other Ambulatory Visit (HOSPITAL_COMMUNITY): Payer: Self-pay

## 2024-09-24 MED ORDER — FLUCONAZOLE 150 MG PO TABS
150.0000 mg | ORAL_TABLET | Freq: Every day | ORAL | 3 refills | Status: DC
  Filled 2024-09-24: qty 5, 5d supply, fill #0

## 2024-09-24 MED ORDER — FLUCONAZOLE 150 MG PO TABS
150.0000 mg | ORAL_TABLET | Freq: Every day | ORAL | 0 refills | Status: DC
  Filled 2024-09-24: qty 5, 5d supply, fill #0

## 2024-09-29 ENCOUNTER — Other Ambulatory Visit (HOSPITAL_COMMUNITY): Payer: Self-pay

## 2024-09-29 DIAGNOSIS — F411 Generalized anxiety disorder: Secondary | ICD-10-CM | POA: Diagnosis not present

## 2024-09-29 DIAGNOSIS — F900 Attention-deficit hyperactivity disorder, predominantly inattentive type: Secondary | ICD-10-CM | POA: Diagnosis not present

## 2024-09-29 MED ORDER — BUSPIRONE HCL 10 MG PO TABS
10.0000 mg | ORAL_TABLET | Freq: Every day | ORAL | 0 refills | Status: AC
  Filled 2024-09-29: qty 90, 90d supply, fill #0

## 2024-09-29 MED ORDER — AMPHETAMINE-DEXTROAMPHETAMINE 10 MG PO TABS
10.0000 mg | ORAL_TABLET | Freq: Every day | ORAL | 0 refills | Status: AC
  Filled 2024-09-29: qty 90, 90d supply, fill #0

## 2024-09-29 MED ORDER — LISDEXAMFETAMINE DIMESYLATE 40 MG PO CAPS
40.0000 mg | ORAL_CAPSULE | Freq: Every day | ORAL | 0 refills | Status: AC
  Filled 2024-09-29: qty 90, 90d supply, fill #0

## 2024-10-08 ENCOUNTER — Other Ambulatory Visit: Payer: Self-pay | Admitting: Medical Genetics

## 2024-10-08 DIAGNOSIS — Z006 Encounter for examination for normal comparison and control in clinical research program: Secondary | ICD-10-CM

## 2024-10-15 ENCOUNTER — Other Ambulatory Visit (HOSPITAL_COMMUNITY): Payer: Self-pay

## 2024-10-17 ENCOUNTER — Other Ambulatory Visit (HOSPITAL_COMMUNITY): Payer: Self-pay

## 2024-10-20 ENCOUNTER — Other Ambulatory Visit (HOSPITAL_COMMUNITY)
Admission: RE | Admit: 2024-10-20 | Discharge: 2024-10-20 | Disposition: A | Discharge status: Home / Self Care | Source: Ambulatory Visit | Attending: Infectious Diseases | Admitting: Infectious Diseases

## 2024-10-20 ENCOUNTER — Other Ambulatory Visit

## 2024-10-20 ENCOUNTER — Telehealth: Payer: Self-pay

## 2024-10-20 ENCOUNTER — Other Ambulatory Visit: Payer: Self-pay | Admitting: Infectious Diseases

## 2024-10-20 ENCOUNTER — Other Ambulatory Visit: Payer: Self-pay

## 2024-10-20 ENCOUNTER — Other Ambulatory Visit (HOSPITAL_COMMUNITY): Payer: Self-pay

## 2024-10-20 DIAGNOSIS — Z113 Encounter for screening for infections with a predominantly sexual mode of transmission: Secondary | ICD-10-CM | POA: Insufficient documentation

## 2024-10-20 MED ORDER — EMTRICITABINE-TENOFOVIR DF 200-300 MG PO TABS
1.0000 | ORAL_TABLET | Freq: Every day | ORAL | 2 refills | Status: DC
  Filled 2024-10-26: qty 30, 30d supply, fill #0
  Filled 2024-11-18: qty 30, 30d supply, fill #1
  Filled 2024-12-16 – 2024-12-31 (×2): qty 30, 30d supply, fill #2

## 2024-10-20 NOTE — Telephone Encounter (Addendum)
 Pharmacy Patient Advocate Encounter  Insurance verification completed.   The patient is insured through [facility]   Ran test claim for Truvada. Currently a quantity of 30 is a 30 day supply and the co-pay is $0.00 . Apretude $0.00   This test claim was processed through [facility]- copay amounts may vary at other pharmacies due to pharmacy/plan contracts, or as the patient moves through the different stages of their insurance plan.

## 2024-10-20 NOTE — Progress Notes (Addendum)
 Specialty Pharmacy Initial Fill Coordination Note  Suzanne Evans is a 43 y.o. female contacted today regarding initial fill of specialty medication(s) Emtricitabine-Tenofovir DF (TRUVADA)   Patient requested [name] at [facility] [place] date: 10/27/24  Medication will be filled on 10/26/24.   Patient is aware of $0 copayment.

## 2024-10-20 NOTE — Addendum Note (Signed)
 Addended by: GRETEL TULLY HERO on: 10/20/2024 04:16 PM   Modules accepted: Orders

## 2024-10-20 NOTE — Progress Notes (Signed)
 Patient starting on PrEP for preventative therapy.

## 2024-10-21 LAB — RPR: RPR Ser Ql: NONREACTIVE

## 2024-10-21 LAB — COMPLETE METABOLIC PANEL WITHOUT GFR
AG Ratio: 1.9 (calc) (ref 1.0–2.5)
ALT: 15 U/L (ref 6–29)
AST: 18 U/L (ref 10–30)
Albumin: 4.7 g/dL (ref 3.6–5.1)
Alkaline phosphatase (APISO): 45 U/L (ref 31–125)
BUN/Creatinine Ratio: 15 (calc) (ref 6–22)
BUN: 15 mg/dL (ref 7–25)
CO2: 27 mmol/L (ref 20–32)
Calcium: 9.4 mg/dL (ref 8.6–10.2)
Chloride: 102 mmol/L (ref 98–110)
Creat: 1.02 mg/dL — ABNORMAL HIGH (ref 0.50–0.99)
Globulin: 2.5 g/dL (ref 1.9–3.7)
Glucose, Bld: 78 mg/dL (ref 65–99)
Potassium: 3.9 mmol/L (ref 3.5–5.3)
Sodium: 137 mmol/L (ref 135–146)
Total Bilirubin: 0.4 mg/dL (ref 0.2–1.2)
Total Protein: 7.2 g/dL (ref 6.1–8.1)

## 2024-10-21 LAB — URINE CYTOLOGY ANCILLARY ONLY
Chlamydia: NEGATIVE
Comment: NEGATIVE
Comment: NEGATIVE
Comment: NORMAL
Neisseria Gonorrhea: NEGATIVE
Trichomonas: NEGATIVE

## 2024-10-21 LAB — HIV ANTIBODY (ROUTINE TESTING W REFLEX)
HIV 1&2 Ab, 4th Generation: NONREACTIVE
HIV FINAL INTERPRETATION: NEGATIVE

## 2024-10-22 ENCOUNTER — Other Ambulatory Visit: Payer: Self-pay | Admitting: Pharmacist

## 2024-10-22 ENCOUNTER — Ambulatory Visit: Payer: Self-pay | Admitting: Infectious Diseases

## 2024-10-22 NOTE — Progress Notes (Signed)
 Specialty Pharmacy Initiation Note   Suzanne Evans is a 43 y.o. female who will be followed by the specialty pharmacy service for RxSp HIV PrEP    Review of administration, indication, effectiveness, safety, potential side effects, storage/disposable, and missed dose instructions occurred today for patient's specialty medication(s) Emtricitabine-Tenofovir DF (TRUVADA)     Patient/Caregiver did not have any additional questions or concerns.   Patient's therapy is appropriate to: Initiate    Goals Addressed             This Visit's Progress    Comply with lab assessments       Patient is on track. Patient will adhere to provider and/or lab appointments      Maintain optimal adherence to therapy       Patient is initiating therapy. Patient will be evaluated at upcoming provider appointment to assess progress      Practice safe sex       Patient is initiating therapy. Patient will be evaluated at upcoming provider appointment to assess progress         Alan JINNY Geralds Specialty Pharmacist

## 2024-10-26 ENCOUNTER — Other Ambulatory Visit (HOSPITAL_COMMUNITY): Payer: Self-pay

## 2024-10-26 ENCOUNTER — Other Ambulatory Visit: Payer: Self-pay

## 2024-11-18 ENCOUNTER — Other Ambulatory Visit (HOSPITAL_COMMUNITY): Payer: Self-pay

## 2024-11-23 ENCOUNTER — Other Ambulatory Visit: Payer: Self-pay

## 2024-11-24 ENCOUNTER — Other Ambulatory Visit: Payer: Self-pay

## 2024-11-24 ENCOUNTER — Ambulatory Visit: Admitting: Nurse Practitioner

## 2024-11-24 VITALS — BP 118/82 | HR 92 | Temp 98.1°F | Ht 65.0 in | Wt 149.6 lb

## 2024-11-24 DIAGNOSIS — Z8742 Personal history of other diseases of the female genital tract: Secondary | ICD-10-CM

## 2024-11-24 DIAGNOSIS — R7689 Other specified abnormal immunological findings in serum: Secondary | ICD-10-CM | POA: Diagnosis not present

## 2024-11-24 DIAGNOSIS — F419 Anxiety disorder, unspecified: Secondary | ICD-10-CM | POA: Diagnosis not present

## 2024-11-24 DIAGNOSIS — F909 Attention-deficit hyperactivity disorder, unspecified type: Secondary | ICD-10-CM

## 2024-11-24 DIAGNOSIS — E538 Deficiency of other specified B group vitamins: Secondary | ICD-10-CM

## 2024-11-24 DIAGNOSIS — Z13220 Encounter for screening for lipoid disorders: Secondary | ICD-10-CM | POA: Diagnosis not present

## 2024-11-24 DIAGNOSIS — D352 Benign neoplasm of pituitary gland: Secondary | ICD-10-CM

## 2024-11-24 DIAGNOSIS — E559 Vitamin D deficiency, unspecified: Secondary | ICD-10-CM

## 2024-11-24 DIAGNOSIS — Z1231 Encounter for screening mammogram for malignant neoplasm of breast: Secondary | ICD-10-CM | POA: Diagnosis not present

## 2024-11-24 LAB — CBC WITH DIFFERENTIAL/PLATELET
Basophils Absolute: 0 K/uL (ref 0.0–0.1)
Basophils Relative: 0.5 % (ref 0.0–3.0)
Eosinophils Absolute: 0.1 K/uL (ref 0.0–0.7)
Eosinophils Relative: 2.4 % (ref 0.0–5.0)
HCT: 41.4 % (ref 36.0–46.0)
Hemoglobin: 14 g/dL (ref 12.0–15.0)
Lymphocytes Relative: 33.8 % (ref 12.0–46.0)
Lymphs Abs: 1.7 K/uL (ref 0.7–4.0)
MCHC: 33.9 g/dL (ref 30.0–36.0)
MCV: 92.8 fl (ref 78.0–100.0)
Monocytes Absolute: 0.4 K/uL (ref 0.1–1.0)
Monocytes Relative: 8.9 % (ref 3.0–12.0)
Neutro Abs: 2.7 K/uL (ref 1.4–7.7)
Neutrophils Relative %: 54.4 % (ref 43.0–77.0)
Platelets: 376 K/uL (ref 150.0–400.0)
RBC: 4.46 Mil/uL (ref 3.87–5.11)
RDW: 13.3 % (ref 11.5–15.5)
WBC: 5 K/uL (ref 4.0–10.5)

## 2024-11-24 LAB — COMPREHENSIVE METABOLIC PANEL WITH GFR
ALT: 10 U/L (ref 0–35)
AST: 16 U/L (ref 0–37)
Albumin: 4.6 g/dL (ref 3.5–5.2)
Alkaline Phosphatase: 47 U/L (ref 39–117)
BUN: 13 mg/dL (ref 6–23)
CO2: 29 meq/L (ref 19–32)
Calcium: 9.5 mg/dL (ref 8.4–10.5)
Chloride: 100 meq/L (ref 96–112)
Creatinine, Ser: 0.91 mg/dL (ref 0.40–1.20)
GFR: 78.5 mL/min (ref >60.00)
Glucose, Bld: 80 mg/dL (ref 70–99)
Potassium: 3.8 meq/L (ref 3.5–5.1)
Sodium: 136 meq/L (ref 135–145)
Total Bilirubin: 0.7 mg/dL (ref 0.2–1.2)
Total Protein: 7.3 g/dL (ref 6.0–8.3)

## 2024-11-24 LAB — LIPID PANEL
Cholesterol: 130 mg/dL (ref 0–200)
HDL: 56.1 mg/dL (ref >39.00)
LDL Cholesterol: 62 mg/dL (ref 0–99)
NonHDL: 74.07
Total CHOL/HDL Ratio: 2
Triglycerides: 59 mg/dL (ref 0.0–149.0)
VLDL: 11.8 mg/dL (ref 0.0–40.0)

## 2024-11-24 LAB — VITAMIN D 25 HYDROXY (VIT D DEFICIENCY, FRACTURES): VITD: 41.29 ng/mL (ref 30.00–100.00)

## 2024-11-24 LAB — TSH: TSH: 0.76 u[IU]/mL (ref 0.35–5.50)

## 2024-11-24 LAB — VITAMIN B12: Vitamin B-12: >1500 pg/mL — ABNORMAL HIGH (ref 211–911)

## 2024-11-24 NOTE — Progress Notes (Signed)
 Leron Glance, NP-C Phone: [phone]  Suzanne Evans is a 43 y.o. female who presents today for transfer of care.   Discussed the use of AI scribe software for clinical note transcription with the patient, who gave verbal consent to proceed.  History of Present Illness   Suzanne Evans is a 42 year old female with hyperprolactinemia and a benign pituitary adenoma who presents for transfer of care.  She has a history of hyperprolactinemia and a benign pituitary adenoma. She reports nipple discharge and a sensation similar to a 'sore milk duct.' Her current medication regimen includes cabergoline , taken twice a week, which was increased from once a week due to previously high prolactin levels. She missed a recent endocrinology appointment and requests to have her prolactin and kidney function checked today.  She has a history of a positive ANA test, with one result being moderately high and another very high. She consulted with rheumatology in [place], but no additional labs were conducted at that time. Recently, she noted elevated kidney function in her labs and is concerned due to her family history of autoimmune conditions, including lupus in her grandmother. She is seeking a second opinion from a rheumatologist within the [facility] system.  She follows a whole food, plant-based diet and has a history of low B12 and vitamin D  levels, which she supplements. She does not consume much meat and is concerned about her B12 levels.  She recently started taking Truvada  in October for prevention purposes related to online dating. She is under the care of ID, who advised her to take it daily until she is stable. She undergoes testing every three months while on this medication.  She has irregular menstrual cycles, occurring every three weeks, and a history of abnormal PAPs. She is seeking a referral to [facility] for further evaluation, as they have a menopause expert.     Social  History   Tobacco Use  Smoking Status Former   Current packs/day: 0.00   Average packs/day: 1 pack/day for 8.0 years (8.0 ttl pk-yrs)   Types: Cigarettes   Start date: 12/30/1996   Quit date: 12/30/2004   Years since quitting: 19.9  Smokeless Tobacco Never  Tobacco Comments   in college only    Current Outpatient Medications on File Prior to Visit  Medication Sig Dispense Refill   albuterol  (VENTOLIN  HFA) 108 (90 Base) MCG/ACT inhaler Inhale 1-2 puffs into the lungs every 6 (six) hours as needed for wheezing or shortness of breath. 6.7 g 12   amphetamine -dextroamphetamine  (ADDERALL) 10 MG tablet Take 1 tablet (10 mg total) by mouth daily at 5pm 90 tablet 0   amphetamine -dextroamphetamine  (ADDERALL) 10 MG tablet Take 1 tablet (10 mg total) by mouth daily at 5pm 90 tablet 0   amphetamine -dextroamphetamine  (ADDERALL) 10 MG tablet Take 1 tablet by mouth daily at 5pm 90 tablet 0   amphetamine -dextroamphetamine  (ADDERALL) 10 MG tablet Take 1 tablet (10 mg total) by mouth daily at 5pm 90 tablet 0   amphetamine -dextroamphetamine  (ADDERALL) 10 MG tablet Take 1 tablet (10 mg total) by mouth daily at 5pm 90 tablet 0   Bacillus Coagulans-Inulin (PROBIOTIC) 1-250 BILLION-MG CAPS Take by mouth.     busPIRone  (BUSPAR ) 10 MG tablet Take 1 tablet (10 mg total) by mouth daily. 90 tablet 0   busPIRone  (BUSPAR ) 10 MG tablet Take 1 tablet (10 mg total) by mouth daily. 90 tablet 0   busPIRone  (BUSPAR ) 10 MG tablet Take 1 tablet (10 mg total) by  mouth daily. 90 tablet 0   cabergoline  (DOSTINEX ) 0.5 MG tablet Take 0.5 tablets (0.25 mg total) by mouth 2 (two) times a week. 4 tablet 1   cabergoline  (DOSTINEX ) 0.5 MG tablet Take 0.5 tablets (0.25 mg total) by mouth 2 (two) times a week. 12 tablet 4   cholecalciferol  (VITAMIN D3) 25 MCG (1000 UNIT) tablet Take 1,000 Units by mouth daily.     clindamycin  (CLINDAGEL ) 1 % gel Apply topically 2 (two) times daily. 60 g 0   Cyanocobalamin  (VITAMIN B-12) 5000 MCG SUBL Take  2,500 mcg by mouth. Take 1 tablet 3 times a week     emtricitabine -tenofovir  (TRUVADA ) 200-300 MG tablet Take 1 tablet by mouth daily. 30 tablet 2   fluticasone  (FLONASE ) 50 MCG/ACT nasal spray Place 2 sprays into both nostrils daily. 16 g 11   ketoconazole  (NIZORAL ) 2 % shampoo Shampoo with liberally as directed three times a week Lather well, let sit 2-3 minutes then rinse. 120 mL 11   lisdexamfetamine  (VYVANSE ) 40 MG capsule Take 1 capsule by mouth daily. 90 capsule 0   lisdexamfetamine  (VYVANSE ) 40 MG capsule Take 1 capsule (40 mg total) by mouth daily. 90 capsule 0   lisdexamfetamine  (VYVANSE ) 40 MG capsule Take 1 capsule (40 mg total) by mouth daily. 90 capsule 0   lisdexamfetamine  (VYVANSE ) 40 MG capsule Take 1 capsule (40 mg total) by mouth daily. 90 capsule 0   lisdexamfetamine  (VYVANSE ) 40 MG capsule Take 1 capsule (40 mg total) by mouth daily. 90 capsule 0   lisdexamfetamine  (VYVANSE ) 40 MG capsule Take 1 capsule (40 mg total) by mouth daily. 90 capsule 0   magnesium oxide (MAG-OX) 400 (240 Mg) MG tablet Take 400 mg by mouth daily.     Multiple Vitamin (MULTIVITAMIN WITH MINERALS) TABS tablet Take 1 tablet by mouth daily.     sodium chloride  (OCEAN) 0.65 % SOLN nasal spray Place 1-2 sprays into both nostrils daily. 30 mL 12   triamcinolone  ointment (KENALOG ) 0.1 % Apply to nails and cuticles every evening before bed 30 g 3   cabergoline  (DOSTINEX ) 0.5 MG tablet Take 0.5 tablets (0.25 mg total) by mouth once a week. 6 tablet 4   ondansetron  (ZOFRAN -ODT) 4 MG disintegrating tablet Dissolve 1 tablet (4 mg total) by mouth every 6 (six) hours as needed for nausea 30 tablet 0   trimethoprim -polymyxin b  (POLYTRIM ) ophthalmic solution Place 1 drop into the right eye in the morning, at noon, in the evening, and at bedtime. For 5 days 10 mL 0   No current facility-administered medications on file prior to visit.     ROS see history of present illness  Objective  Physical Exam Vitals:    11/24/24 0949  BP: 118/82  Pulse: 92  Temp: 98.1 F (36.7 C)  SpO2: 98%    BP Readings from Last 3 Encounters:  11/24/24 118/82  11/07/23 118/78  08/18/23 111/75   Wt Readings from Last 3 Encounters:  11/24/24 149 lb 9.6 oz (67.9 kg)  11/07/23 148 lb (67.1 kg)  08/18/23 141 lb (64 kg)    Physical Exam Constitutional:      General: She is not in acute distress.    Appearance: Normal appearance.  HENT:     Head: Normocephalic.  Cardiovascular:     Rate and Rhythm: Normal rate and regular rhythm.     Heart sounds: Normal heart sounds.  Pulmonary:     Effort: Pulmonary effort is normal.     Breath sounds: Normal breath  sounds.  Skin:    General: Skin is warm and dry.  Neurological:     General: No focal deficit present.     Mental Status: She is alert.  Psychiatric:        Mood and Affect: Mood normal.        Behavior: Behavior normal.      Assessment/Plan: Please see individual problem list.  Prolactinoma (HCC) Assessment & Plan: Hyperprolactinemia with nipple discharge is due to a benign pituitary neoplasm. She is on cabergoline  for elevated prolactin. Prolactin levels were checked today. Continue cabergoline  as prescribed and follow up with Endocrinology as scheduled.  Orders: -     CBC with Differential/Platelet -     Comprehensive metabolic panel with GFR -     Prolactin  ANA positive Assessment & Plan: There is an abnormal ANA with a family history of lupus. A previous rheumatology evaluation was normal, but she is seeking a second opinion due to family history and elevated kidney function. Refer to Rheumatology for further evaluation.   Orders: -     Analyzer ANA IFA w/RFLX Titer/Pattern,Systemic Autoimmune Panel 1 -     Ambulatory referral to Rheumatology  Attention deficit hyperactivity disorder (ADHD), unspecified ADHD type Assessment & Plan: Continue current management- Vyvanse  and Adderall. Follow up with Psychiatry.    Anxiety Assessment &  Plan: Stable on current regimen. Continue. Follow up with Psychiatry.   Orders: -     TSH  Hx of abnormal cervical Pap smear Assessment & Plan: She experiences irregular menstruation every three weeks and has a history of abnormal PAPs. Prefers a gynecologist with menopause expertise. Referred to [facility] for evaluation.  Orders: -     Ambulatory referral to Obstetrics / Gynecology  Vitamin B 12 deficiency Assessment & Plan: Continue daily OTC supplementation. Check vitamin B12 level.   Orders: -     Vitamin B12  Vitamin D  deficiency Assessment & Plan: Continue daily OTC supplementation. Check vitamin D  level.   Orders: -     VITAMIN D  25 Hydroxy (Vit-D Deficiency, Fractures)  Screening mammogram for breast cancer -     MM 3D DIAGNOSTIC MAMMOGRAM BILATERAL BREAST; Future  Lipid screening -     Lipid panel    Return for as needed.   Leron Glance, NP-C [facility]

## 2024-11-24 NOTE — Progress Notes (Signed)
 Specialty Pharmacy Refill Coordination Note  Suzanne Evans is a 43 y.o. female contacted today regarding refills of specialty medication(s) Emtricitabine -Tenofovir  DF (TRUVADA )   Patient requested Delivery   Delivery date: 11/29/24   Verified address: [address] [place] [address]   Medication will be filled on: 11/26/24

## 2024-12-01 LAB — ANALYZER(R)ANA IFA WITH REFLEX TITER/PATTRN,SYS AUTOIMM PNL1
Anti Nuclear Antibody (ANA): NEGATIVE
Anticardiolipin IgA: <2 [APL'U]/mL
Anticardiolipin IgG: <2 [GPL'U]/mL
Anticardiolipin IgM: 2.3 [MPL'U]/mL
Beta-2 Glyco 1 IgA: <2 U/mL
Beta-2 Glyco 1 IgM: <2 U/mL
Beta-2 Glyco I IgG: <2 U/mL
C3 Complement: 118 mg/dL (ref 83–193)
C4 Complement: 14 mg/dL — ABNORMAL LOW (ref 15–57)
Centromere Ab Screen: <1 AI
Chromatin (Nucleosomal) Antibody: 1.4 AI — AB
Cyclic Citrullin Peptide Ab: <16 U
DNA Ab (DS) Crithidia, IFA: NEGATIVE
ENA SM Ab Ser-aCnc: <1 AI
Jo-1 Autoabs: <1 AI
MUTATED CITRULLINATED VIMENTIN (MCV) AB: <20 U/mL (ref <20)
Rheumatoid Factor (IgA): <5 U
Rheumatoid Factor (IgG): <5 U
Rheumatoid Factor (IgM): <5 U
Ribonucleic Protein(ENA) Antibody, IgG: <1 AI
SM/RNP: <1 AI
SSA (Ro) (ENA) Antibody, IgG: <1 AI
SSB (La) (ENA) Antibody, IgG: <1 AI
Scleroderma (Scl-70) (ENA) Antibody, IgG: <1 AI
Thyroperoxidase Ab SerPl-aCnc: 1 [IU]/mL (ref <9)

## 2024-12-01 LAB — PROLACTIN: Prolactin: 3.4 ng/mL

## 2024-12-02 ENCOUNTER — Telehealth: Payer: Self-pay

## 2024-12-02 NOTE — Telephone Encounter (Signed)
 [facility] orders placed in provider to be signed folder

## 2024-12-03 ENCOUNTER — Ambulatory Visit: Payer: Self-pay | Admitting: Nurse Practitioner

## 2024-12-03 DIAGNOSIS — N6452 Nipple discharge: Secondary | ICD-10-CM | POA: Diagnosis not present

## 2024-12-03 DIAGNOSIS — R928 Other abnormal and inconclusive findings on diagnostic imaging of breast: Secondary | ICD-10-CM | POA: Diagnosis not present

## 2024-12-03 LAB — HM MAMMOGRAPHY

## 2024-12-08 NOTE — Telephone Encounter (Signed)
 Noted

## 2024-12-10 NOTE — Telephone Encounter (Signed)
 Form faxed to [facility]

## 2024-12-13 ENCOUNTER — Encounter: Payer: Self-pay | Admitting: Nurse Practitioner

## 2024-12-13 DIAGNOSIS — Z8742 Personal history of other diseases of the female genital tract: Secondary | ICD-10-CM | POA: Insufficient documentation

## 2024-12-13 NOTE — Assessment & Plan Note (Signed)
 Continue daily OTC supplementation. Check vitamin D level.

## 2024-12-13 NOTE — Assessment & Plan Note (Signed)
 There is an abnormal ANA with a family history of lupus. A previous rheumatology evaluation was normal, but she is seeking a second opinion due to family history and elevated kidney function. Refer to Rheumatology for further evaluation.

## 2024-12-13 NOTE — Assessment & Plan Note (Signed)
 Continue daily OTC supplementation. Check vitamin B12 level.

## 2024-12-13 NOTE — Assessment & Plan Note (Signed)
 Continue current management- Vyvanse  and Adderall. Follow up with Psychiatry.

## 2024-12-13 NOTE — Assessment & Plan Note (Signed)
 She experiences irregular menstruation every three weeks and has a history of abnormal PAPs. Prefers a gynecologist with menopause expertise. Referred to [facility] for evaluation.

## 2024-12-13 NOTE — Assessment & Plan Note (Signed)
 Stable on current regimen. Continue. Follow up with Psychiatry.

## 2024-12-13 NOTE — Assessment & Plan Note (Signed)
 Hyperprolactinemia with nipple discharge is due to a benign pituitary neoplasm. She is on cabergoline  for elevated prolactin. Prolactin levels were checked today. Continue cabergoline  as prescribed and follow up with Endocrinology as scheduled.

## 2024-12-16 ENCOUNTER — Other Ambulatory Visit: Payer: Self-pay

## 2024-12-21 ENCOUNTER — Other Ambulatory Visit: Payer: Self-pay

## 2024-12-22 ENCOUNTER — Other Ambulatory Visit: Payer: Self-pay

## 2024-12-24 ENCOUNTER — Other Ambulatory Visit (HOSPITAL_COMMUNITY): Payer: Self-pay

## 2024-12-27 ENCOUNTER — Other Ambulatory Visit (HOSPITAL_COMMUNITY): Payer: Self-pay

## 2024-12-27 MED ORDER — LISDEXAMFETAMINE DIMESYLATE 40 MG PO CAPS: 40.0000 mg | ORAL_CAPSULE | Freq: Every day | ORAL | 0 refills | Status: AC

## 2024-12-27 MED ORDER — BUSPIRONE HCL 10 MG PO TABS
10.0000 mg | ORAL_TABLET | Freq: Every day | ORAL | 0 refills | Status: AC
  Filled 2024-12-27: qty 90, 90d supply, fill #0

## 2024-12-27 MED ORDER — AMPHETAMINE-DEXTROAMPHETAMINE 10 MG PO TABS
10.0000 mg | ORAL_TABLET | Freq: Every day | ORAL | 0 refills | Status: AC
  Filled 2024-12-27: qty 90, 90d supply, fill #0

## 2024-12-28 ENCOUNTER — Other Ambulatory Visit (HOSPITAL_COMMUNITY): Payer: Self-pay

## 2024-12-28 ENCOUNTER — Other Ambulatory Visit: Payer: Self-pay | Admitting: Nurse Practitioner

## 2024-12-28 DIAGNOSIS — Z8742 Personal history of other diseases of the female genital tract: Secondary | ICD-10-CM

## 2024-12-30 ENCOUNTER — Encounter (HOSPITAL_COMMUNITY): Payer: Self-pay

## 2024-12-31 ENCOUNTER — Other Ambulatory Visit: Payer: Self-pay

## 2024-12-31 ENCOUNTER — Other Ambulatory Visit: Payer: Self-pay | Admitting: Pharmacy Technician

## 2024-12-31 NOTE — Progress Notes (Signed)
 Specialty Pharmacy Refill Coordination Note  Suzanne Evans is a 44 y.o. female contacted today regarding refills of specialty medication(s) Emtricitabine -Tenofovir  DF (TRUVADA )   Patient requested Delivery   Delivery date: 01/03/25   Verified address: [address]  [place]   Medication will be filled on: 12/31/24

## 2025-01-13 ENCOUNTER — Other Ambulatory Visit (HOSPITAL_COMMUNITY): Payer: Self-pay

## 2025-01-14 ENCOUNTER — Other Ambulatory Visit (HOSPITAL_COMMUNITY): Payer: Self-pay

## 2025-01-17 NOTE — Progress Notes (Unsigned)
 "               Suzanne Evans D.[facility] [address] [place] [address] Phone: [phone]   Assessment and Plan:     1. Chronic left hip pain (Primary) 2. 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  Pain has gradually returned over the past 2 months -Continue HEP - Use Tylenol  500 to 1000 mg tablets 2-3 times a day for day-to-day pain relief - We will plan on repeat intra-articular left hip PRP injection  Pertinent previous records reviewed include none   Follow Up: Follow-up as soon as able for intra-articular left hip PRP injection   Subjective:   I, Suzanne Evans, am serving as a neurosurgeon for Doctor Morene Evans   Chief Complaint: left hip pain    HPI:  04/18/2022 Patient is a 44 year old female complaining of left hip pain. Patient states she was doing a boot camp 1-2 years ago and felt pain in her anterior hip, has taken aleve , has given boot camps a rest , any time she walks more than 5000 time a day it bothers her , now she does kick boxing, it hurts to lay down butterfly kicks or anything that involves being on the ground and hip motions, does not get numbness or tingling as of recent took some prednisone and it went away, pain stays in the hip and doesn't radiate, jogging hurts, does get popping bilaterally not painful, it actually relieves some of the pain but not long term   05/09/2022 Patient states that hip is slightly improved went to [employer] and walked a lot  and was in pain , had COVID and was  resting so no pain then , feels more like a dull toothache now still constant    06/21/2022 Patient states that she is doing the same    07/11/2022 Patient states is okay sometimes she will get an ache down her leg but not a strong pain ,more like a tooth ache,  only happens on occasions when she is on her feet for a long time    11/07/2023 Patient states that her hip is bothering her again    11/10/2023 Patient presents for PRP injection, procedure only visit.  01/18/2025 Patient states pain came back end of November    Relevant Historical Information: None pertinent    Additional pertinent review of systems negative.  Current Medications[1]   Objective:     Vitals:   01/18/25 1500  Pulse: (!) 55  SpO2: 100%  Weight: 140 lb (63.5 kg)  Height: 5' 5 (1.651 m)      Body mass index is 23.3 kg/m.    Physical Exam:    General: awake, alert, and oriented no acute distress, nontoxic Skin: no suspicious lesions or rashes Neuro:sensation intact distally with no dificits, normal muscle tone, no atrophy, strength 5/5 in all tested lower ext groups Psych: normal mood and affect, speech clear   Left hip: No deformity, swelling or wasting ROM Flexion 90 (causes pain), ext 30, IR 45, ER 45 NTTP over the hip flexors, greater  troch, glute musculature, si joint, lumbar spine Negative log roll with FROM Positive FABER for mild groin pain Positive FADIR for moderate groin pain Negative Piriformis test Negative trendelenberg Gait normal    Electronically signed by:  Suzanne Evans D.[facility] 3:15 PM 01/18/25     [1]  Current Outpatient Medications:    albuterol  (VENTOLIN  HFA) 108 (90 Base) MCG/ACT inhaler, Inhale 1-2 puffs into the lungs every 6 (six) hours as needed for wheezing or shortness of breath., Disp: 6.7 g, Rfl: 12   amphetamine -dextroamphetamine  (ADDERALL) 10 MG tablet, Take 1 tablet (10 mg total) by mouth daily at 5pm, Disp: 90 tablet, Rfl:  0   amphetamine -dextroamphetamine  (ADDERALL) 10 MG tablet, Take 1 tablet (10 mg total) by mouth daily at 5pm, Disp: 90 tablet, Rfl: 0   amphetamine -dextroamphetamine  (ADDERALL) 10 MG tablet, Take 1 tablet by mouth daily at 5pm, Disp: 90 tablet, Rfl: 0   amphetamine -dextroamphetamine  (ADDERALL) 10 MG tablet, Take 1 tablet (10 mg total) by mouth daily at 5pm, Disp: 90 tablet, Rfl: 0   amphetamine -dextroamphetamine  (ADDERALL) 10 MG tablet, Take 1 tablet (10 mg total) by mouth daily at 5pm, Disp: 90 tablet, Rfl: 0   amphetamine -dextroamphetamine  (ADDERALL) 10 MG tablet, Take 1 tablet (10 mg total) by mouth daily at 5 pm., Disp: 90 tablet, Rfl: 0   Bacillus Coagulans-Inulin (PROBIOTIC) 1-250 BILLION-MG CAPS, Take by mouth., Disp: , Rfl:    busPIRone  (BUSPAR ) 10 MG tablet, Take 1 tablet (10 mg total) by mouth daily., Disp: 90 tablet, Rfl: 0   busPIRone  (BUSPAR ) 10 MG tablet, Take 1 tablet (10 mg total) by mouth daily., Disp: 90 tablet, Rfl: 0   busPIRone  (BUSPAR ) 10 MG tablet, Take 1 tablet (10 mg total) by mouth daily., Disp: 90 tablet, Rfl: 0   busPIRone  (BUSPAR ) 10 MG tablet, Take 1 tablet (10 mg total) by mouth daily., Disp: 90 tablet, Rfl: 0   cabergoline  (DOSTINEX ) 0.5 MG tablet, Take 0.5 tablets (0.25 mg total) by mouth once a week., Disp: 6 tablet, Rfl: 4   cabergoline  (DOSTINEX ) 0.5 MG tablet, Take 0.5 tablets (0.25 mg total) by mouth 2 (two) times a week., Disp: 4 tablet, Rfl: 1   cabergoline  (DOSTINEX ) 0.5 MG tablet, Take 0.5 tablets (0.25 mg total) by mouth 2 (two) times a week., Disp: 12 tablet, Rfl: 4   cholecalciferol  (VITAMIN D3) 25 MCG (1000 UNIT) tablet, Take 1,000 Units by mouth daily., Disp: , Rfl:    clindamycin  (CLINDAGEL ) 1 % gel, Apply topically 2 (two) times daily., Disp: 60 g, Rfl: 0   Cyanocobalamin  (VITAMIN B-12) 5000 MCG SUBL, Take 2,500 mcg by mouth. Take 1 tablet 3 times a week, Disp: , Rfl:    emtricitabine -tenofovir  (TRUVADA ) 200-300 MG tablet, Take 1 tablet by mouth  daily., Disp: 30 tablet, Rfl: 2   fluticasone  (FLONASE ) 50 MCG/ACT nasal spray, Place 2 sprays into both nostrils daily., Disp: 16 g, Rfl: 11   ketoconazole  (NIZORAL ) 2 % shampoo, Shampoo with liberally as directed three times a week Lather well, let sit 2-3 minutes then rinse., Disp: 120 mL, Rfl: 11   lisdexamfetamine  (VYVANSE ) 40 MG capsule, Take 1 capsule by mouth daily., Disp: 90 capsule, Rfl: 0   lisdexamfetamine  (VYVANSE ) 40 MG capsule, Take 1 capsule (40 mg total) by mouth daily., Disp: 90 capsule, Rfl: 0   lisdexamfetamine  (VYVANSE ) 40 MG capsule, Take 1 capsule (40 mg total) by mouth daily., Disp: 90 capsule, Rfl: 0   lisdexamfetamine  (VYVANSE ) 40 MG capsule, Take 1 capsule (  40 mg total) by mouth daily., Disp: 90 capsule, Rfl: 0   lisdexamfetamine  (VYVANSE ) 40 MG capsule, Take 1 capsule (40 mg total) by mouth daily., Disp: 90 capsule, Rfl: 0   lisdexamfetamine  (VYVANSE ) 40 MG capsule, Take 1 capsule (40 mg total) by mouth daily., Disp: 90 capsule, Rfl: 0   lisdexamfetamine  (VYVANSE ) 40 MG capsule, Take 1 capsule (40 mg total) by mouth daily., Disp: 90 capsule, Rfl: 0   magnesium oxide (MAG-OX) 400 (240 Mg) MG tablet, Take 400 mg by mouth daily., Disp: , Rfl:    Multiple Vitamin (MULTIVITAMIN WITH MINERALS) TABS tablet, Take 1 tablet by mouth daily., Disp: , Rfl:    ondansetron  (ZOFRAN -ODT) 4 MG disintegrating tablet, Dissolve 1 tablet (4 mg total) by mouth every 6 (six) hours as needed for nausea, Disp: 30 tablet, Rfl: 0   sodium chloride  (OCEAN) 0.65 % SOLN nasal spray, Place 1-2 sprays into both nostrils daily., Disp: 30 mL, Rfl: 12   triamcinolone  ointment (KENALOG ) 0.1 %, Apply to nails and cuticles every evening before bed, Disp: 30 g, Rfl: 3   trimethoprim -polymyxin b  (POLYTRIM ) ophthalmic solution, Place 1 drop into the right eye in the morning, at noon, in the evening, and at bedtime. For 5 days, Disp: 10 mL, Rfl: 0  "

## 2025-01-18 ENCOUNTER — Ambulatory Visit: Admitting: Sports Medicine

## 2025-01-18 ENCOUNTER — Other Ambulatory Visit: Payer: Self-pay | Admitting: Infectious Diseases

## 2025-01-18 VITALS — HR 55 | Ht 65.0 in | Wt 140.0 lb

## 2025-01-18 DIAGNOSIS — G8929 Other chronic pain: Secondary | ICD-10-CM | POA: Diagnosis not present

## 2025-01-18 DIAGNOSIS — Z113 Encounter for screening for infections with a predominantly sexual mode of transmission: Secondary | ICD-10-CM

## 2025-01-18 DIAGNOSIS — S73192D Other sprain of left hip, subsequent encounter: Secondary | ICD-10-CM

## 2025-01-18 DIAGNOSIS — Z79899 Other long term (current) drug therapy: Secondary | ICD-10-CM

## 2025-01-18 DIAGNOSIS — M25552 Pain in left hip: Secondary | ICD-10-CM

## 2025-01-18 NOTE — Patient Instructions (Signed)
 Thank you for coming in today  Continue home exercise program  - Use Tylenol  500 to 1000 mg tablets 2-3 times a day for day-to-day pain relief  At checkout, schedule follow-up appointment for PRP injection at the earliest convenience.

## 2025-01-20 ENCOUNTER — Other Ambulatory Visit: Payer: Self-pay

## 2025-01-20 ENCOUNTER — Other Ambulatory Visit (HOSPITAL_COMMUNITY)
Admission: RE | Admit: 2025-01-20 | Discharge: 2025-01-20 | Disposition: A | Source: Ambulatory Visit | Attending: Infectious Disease | Admitting: Infectious Disease

## 2025-01-20 DIAGNOSIS — Z113 Encounter for screening for infections with a predominantly sexual mode of transmission: Secondary | ICD-10-CM

## 2025-01-20 DIAGNOSIS — Z79899 Other long term (current) drug therapy: Secondary | ICD-10-CM

## 2025-01-21 ENCOUNTER — Other Ambulatory Visit: Payer: Self-pay

## 2025-01-21 ENCOUNTER — Other Ambulatory Visit: Payer: Self-pay | Admitting: Infectious Diseases

## 2025-01-21 LAB — HIV ANTIBODY (ROUTINE TESTING W REFLEX)
HIV 1&2 Ab, 4th Generation: NONREACTIVE
HIV FINAL INTERPRETATION: NEGATIVE

## 2025-01-21 LAB — BASIC METABOLIC PANEL WITH GFR
BUN: 15 mg/dL (ref 7–25)
CO2: 26 mmol/L (ref 20–32)
Calcium: 9.6 mg/dL (ref 8.6–10.2)
Chloride: 102 mmol/L (ref 98–110)
Creat: 0.72 mg/dL (ref 0.50–0.99)
Glucose, Bld: 71 mg/dL (ref 65–99)
Potassium: 3.6 mmol/L (ref 3.5–5.3)
Sodium: 137 mmol/L (ref 135–146)
eGFR: 108 mL/min/1.73m2

## 2025-01-21 LAB — SYPHILIS: RPR W/REFLEX TO RPR TITER AND TREPONEMAL ANTIBODIES, TRADITIONAL SCREENING AND DIAGNOSIS ALGORITHM: RPR Ser Ql: NONREACTIVE

## 2025-01-21 LAB — PROTEIN, URINE, RANDOM: Total Protein, Urine: <4 mg/dL — ABNORMAL LOW (ref 5–24)

## 2025-01-21 NOTE — Progress Notes (Unsigned)
 "               Odis Mace D[facility] [address] [place] [address] Phone: [phone]   Assessment and Plan:     ***    Pertinent previous records reviewed include ***   Follow Up: ***     Subjective:   I, Jasaun Carn, am serving as a neurosurgeon for Doctor Morene Mace   Chief Complaint: left hip pain    HPI:  04/18/2022 Patient is a 44 year old female complaining of left hip pain. Patient states she was doing a boot camp 1-2 years ago and felt pain in her anterior hip, has taken aleve , has given boot camps a rest , any time she walks more than 5000 time a day it bothers her , now she does kick boxing, it hurts to lay down butterfly kicks or anything that involves being on the ground and hip motions, does not get numbness or tingling as of recent took some prednisone and it went away, pain stays in the hip and doesn't radiate, jogging hurts, does get popping bilaterally not painful, it actually relieves some of the pain but not long term   05/09/2022 Patient states that hip is slightly improved went to [employer] and walked a lot  and was in pain , had COVID and was resting so no pain then , feels more like a dull toothache now still constant    06/21/2022 Patient states that she is doing the same    07/11/2022 Patient states is okay sometimes she will get an ache down her leg but not a strong pain ,more like a tooth ache,  only happens on occasions when she is on her feet for a long time    11/07/2023 Patient states that her hip is bothering her again    11/10/2023 Patient presents for PRP injection, procedure only visit.   01/18/2025 Patient states pain came back end of November   01/25/2025 Patient states   Relevant Historical Information: None pertinent  Additional pertinent review of systems negative.  Current Medications[1]   Objective:     There were no vitals filed for this visit.    There is no height or weight on  file to calculate BMI.    Physical Exam:    ***   Electronically signed by:  Odis Mace [facility] 7:17 AM 01/21/25    [1]  Current Outpatient Medications:    albuterol  (VENTOLIN  HFA) 108 (90 Base) MCG/ACT inhaler, Inhale 1-2 puffs into the lungs every 6 (six) hours as needed for wheezing or shortness of breath., Disp: 6.7 g, Rfl: 12   amphetamine -dextroamphetamine  (ADDERALL) 10 MG tablet, Take 1 tablet (10 mg total) by mouth daily at 5pm, Disp: 90 tablet, Rfl: 0   amphetamine -dextroamphetamine  (ADDERALL) 10 MG tablet, Take 1 tablet (10 mg total) by mouth daily at 5pm, Disp: 90 tablet, Rfl: 0   amphetamine -dextroamphetamine  (ADDERALL) 10 MG tablet, Take 1 tablet by mouth daily at 5pm, Disp: 90 tablet, Rfl: 0   amphetamine -dextroamphetamine  (ADDERALL) 10 MG tablet, Take 1 tablet (10 mg total) by mouth daily at 5pm, Disp: 90 tablet, Rfl: 0   amphetamine -dextroamphetamine  (ADDERALL) 10 MG tablet, Take 1 tablet (10 mg total) by mouth daily at 5pm, Disp: 90 tablet, Rfl: 0   amphetamine -dextroamphetamine  (ADDERALL) 10 MG tablet, Take 1 tablet (10 mg total) by mouth daily at 5 pm., Disp: 90 tablet, Rfl: 0   Bacillus Coagulans-Inulin (  PROBIOTIC) 1-250 BILLION-MG CAPS, Take by mouth., Disp: , Rfl:    busPIRone  (BUSPAR ) 10 MG tablet, Take 1 tablet (10 mg total) by mouth daily., Disp: 90 tablet, Rfl: 0   busPIRone  (BUSPAR ) 10 MG tablet, Take 1 tablet (10 mg total) by mouth daily., Disp: 90 tablet, Rfl: 0   busPIRone  (BUSPAR ) 10 MG tablet, Take 1 tablet (10 mg total) by mouth daily., Disp: 90 tablet, Rfl: 0   busPIRone  (BUSPAR ) 10 MG tablet, Take 1 tablet (10 mg total) by mouth daily., Disp: 90 tablet, Rfl: 0   cabergoline  (DOSTINEX ) 0.5 MG tablet, Take 0.5 tablets (0.25 mg total) by mouth once a week., Disp: 6 tablet, Rfl: 4   cabergoline  (DOSTINEX ) 0.5 MG tablet, Take 0.5 tablets (0.25 mg total) by mouth 2 (two) times a week., Disp: 4 tablet, Rfl: 1   cabergoline   (DOSTINEX ) 0.5 MG tablet, Take 0.5 tablets (0.25 mg total) by mouth 2 (two) times a week., Disp: 12 tablet, Rfl: 4   cholecalciferol  (VITAMIN D3) 25 MCG (1000 UNIT) tablet, Take 1,000 Units by mouth daily., Disp: , Rfl:    clindamycin  (CLINDAGEL ) 1 % gel, Apply topically 2 (two) times daily., Disp: 60 g, Rfl: 0   Cyanocobalamin  (VITAMIN B-12) 5000 MCG SUBL, Take 2,500 mcg by mouth. Take 1 tablet 3 times a week, Disp: , Rfl:    emtricitabine -tenofovir  (TRUVADA ) 200-300 MG tablet, Take 1 tablet by mouth daily., Disp: 30 tablet, Rfl: 2   fluticasone  (FLONASE ) 50 MCG/ACT nasal spray, Place 2 sprays into both nostrils daily., Disp: 16 g, Rfl: 11   ketoconazole  (NIZORAL ) 2 % shampoo, Shampoo with liberally as directed three times a week Lather well, let sit 2-3 minutes then rinse., Disp: 120 mL, Rfl: 11   lisdexamfetamine  (VYVANSE ) 40 MG capsule, Take 1 capsule by mouth daily., Disp: 90 capsule, Rfl: 0   lisdexamfetamine  (VYVANSE ) 40 MG capsule, Take 1 capsule (40 mg total) by mouth daily., Disp: 90 capsule, Rfl: 0   lisdexamfetamine  (VYVANSE ) 40 MG capsule, Take 1 capsule (40 mg total) by mouth daily., Disp: 90 capsule, Rfl: 0   lisdexamfetamine  (VYVANSE ) 40 MG capsule, Take 1 capsule (40 mg total) by mouth daily., Disp: 90 capsule, Rfl: 0   lisdexamfetamine  (VYVANSE ) 40 MG capsule, Take 1 capsule (40 mg total) by mouth daily., Disp: 90 capsule, Rfl: 0   lisdexamfetamine  (VYVANSE ) 40 MG capsule, Take 1 capsule (40 mg total) by mouth daily., Disp: 90 capsule, Rfl: 0   lisdexamfetamine  (VYVANSE ) 40 MG capsule, Take 1 capsule (40 mg total) by mouth daily., Disp: 90 capsule, Rfl: 0   magnesium oxide (MAG-OX) 400 (240 Mg) MG tablet, Take 400 mg by mouth daily., Disp: , Rfl:    Multiple Vitamin (MULTIVITAMIN WITH MINERALS) TABS tablet, Take 1 tablet by mouth daily., Disp: , Rfl:    ondansetron  (ZOFRAN -ODT) 4 MG disintegrating tablet, Dissolve 1 tablet (4 mg total) by mouth every 6 (six) hours as needed for  nausea, Disp: 30 tablet, Rfl: 0   sodium chloride  (OCEAN) 0.65 % SOLN nasal spray, Place 1-2 sprays into both nostrils daily., Disp: 30 mL, Rfl: 12   triamcinolone  ointment (KENALOG ) 0.1 %, Apply to nails and cuticles every evening before bed, Disp: 30 g, Rfl: 3   trimethoprim -polymyxin b  (POLYTRIM ) ophthalmic solution, Place 1 drop into the right eye in the morning, at noon, in the evening, and at bedtime. For 5 days, Disp: 10 mL, Rfl: 0  "

## 2025-01-24 ENCOUNTER — Other Ambulatory Visit: Payer: Self-pay

## 2025-01-25 ENCOUNTER — Other Ambulatory Visit: Payer: Self-pay

## 2025-01-25 ENCOUNTER — Encounter (INDEPENDENT_AMBULATORY_CARE_PROVIDER_SITE_OTHER): Payer: Self-pay

## 2025-01-25 ENCOUNTER — Ambulatory Visit: Payer: Self-pay | Admitting: Sports Medicine

## 2025-01-25 MED ORDER — EMTRICITABINE-TENOFOVIR DF 200-300 MG PO TABS
1.0000 | ORAL_TABLET | Freq: Every day | ORAL | 2 refills | Status: AC
  Filled 2025-01-25 (×2): qty 30, 30d supply, fill #0

## 2025-01-25 NOTE — Telephone Encounter (Signed)
 Okay to refill? Does she need appt? Thanks!

## 2025-01-25 NOTE — Telephone Encounter (Signed)
 Tolerating truvada  well w/o side effect.   BMP reviewed - Creatinine stable.  HIV Ab nonreactive   Refill Truvada  x 27m - next labs due end of April.

## 2025-01-26 ENCOUNTER — Other Ambulatory Visit (HOSPITAL_COMMUNITY): Payer: Self-pay

## 2025-01-26 LAB — URINE CYTOLOGY ANCILLARY ONLY
Chlamydia: NEGATIVE
Comment: NEGATIVE
Comment: NEGATIVE
Comment: NORMAL
Neisseria Gonorrhea: NEGATIVE
Trichomonas: NEGATIVE

## 2025-01-26 NOTE — Progress Notes (Signed)
 Specialty Pharmacy Refill Coordination Note  Suzanne Evans is a 44 y.o. female contacted today regarding refills of specialty medication(s) Emtricitabine -Tenofovir  DF (TRUVADA )   Patient requested Delivery   Delivery date: 01/28/25   Verified address: [address] [place] [address]   Medication will be filled on: 01/27/25

## 2025-01-27 ENCOUNTER — Other Ambulatory Visit: Payer: Self-pay

## 2025-01-27 NOTE — Progress Notes (Signed)
 "               Suzanne Evans D[facility] [address] [place] [address] Phone: [phone]   Assessment and Plan:     1. Chronic left hip pain 2. 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  Tolerated well per note below - Continue HEP and start physical therapy   Procedure: Ultrasound Guided Hip Acetabulofemoral Joint Injection Side: Left Diagnosis: Left hip pain with labral tear and cartilage loss US  Indication:  - accuracy is paramount for diagnosis - to ensure therapeutic efficacy or procedural success - to reduce procedural risk   After explaining the procedure, viable alternatives, risks, and answering any questions, consent was given verbally. The site was cleaned with chlorhexidine prep. An ultrasound transducer was placed on the anterior thigh/hip.   The acetabular joint, labrum, and femoral shaft were identified.  The neurovascular structures were identified and an approach was found specifically avoiding these structures.  A steroid injection was performed under ultrasound guidance with sterile technique using 2ml of 1% lidocaine  without epinephrine  and 6mL of PRP. This was well tolerated and resulted in  relief.  Needle was removed and dressing placed and post injection instructions were given including  a discussion of likely return of pain today after the anesthetic wears off (with the possibility of worsened pain) until the steroid starts to work in 1-3 days.   Pt was advised to call or return to clinic if these symptoms worsen or fail to improve as  anticipated.     Pertinent previous records reviewed include none   Follow Up: As needed if no improvement or worsening of symptoms    Subjective:   I, [name], am serving as a neurosurgeon for Doctor Morene Evans   Chief Complaint: left hip pain    HPI:  04/18/2022 Patient is a 44 year old female complaining of left hip pain. Patient states she was doing a boot camp 1-2 years ago and felt pain in her anterior hip, has taken aleve , has given boot camps a rest , any time she walks more than 5000 time a day it bothers her , now she does kick boxing, it hurts to lay down butterfly kicks or anything that involves being on the ground and hip motions, does not get numbness or tingling as of recent took some prednisone and it went away, pain stays in the hip and doesn't radiate, jogging hurts, does get popping bilaterally not painful, it actually relieves some of the pain but not long term   05/09/2022 Patient states that hip is slightly improved went to [employer] and walked a lot  and was in pain , had COVID and was resting so no pain then , feels more like a dull toothache now still constant    06/21/2022 Patient states that she is doing the same    07/11/2022 Patient states is okay sometimes she will get an ache down her leg but not a strong pain ,more like a tooth ache,  only happens on occasions when she is on her feet for a long time    11/07/2023 Patient states that her hip  is bothering her again    11/10/2023 Patient presents for PRP injection, procedure only visit.   01/18/2025 Patient states pain came back end of November   01/28/2025 Patient states here for PRP    Relevant Historical Information: None pertinent  Additional pertinent review of systems negative.  Current Medications[1]       Electronically signed by:  Suzanne Evans [facility] 3:26 PM 01/28/25     [1]  Current Outpatient Medications:    albuterol  (VENTOLIN  HFA) 108 (90  Base) MCG/ACT inhaler, Inhale 1-2 puffs into the lungs every 6 (six) hours as needed for wheezing or shortness of breath., Disp: 6.7 g, Rfl: 12   amphetamine -dextroamphetamine  (ADDERALL) 10 MG tablet, Take 1 tablet (10 mg total) by mouth daily at 5pm, Disp: 90 tablet, Rfl: 0   amphetamine -dextroamphetamine  (ADDERALL) 10 MG tablet, Take 1 tablet (10 mg total) by mouth daily at 5pm, Disp: 90 tablet, Rfl: 0   amphetamine -dextroamphetamine  (ADDERALL) 10 MG tablet, Take 1 tablet by mouth daily at 5pm, Disp: 90 tablet, Rfl: 0   amphetamine -dextroamphetamine  (ADDERALL) 10 MG tablet, Take 1 tablet (10 mg total) by mouth daily at 5pm, Disp: 90 tablet, Rfl: 0   amphetamine -dextroamphetamine  (ADDERALL) 10 MG tablet, Take 1 tablet (10 mg total) by mouth daily at 5pm, Disp: 90 tablet, Rfl: 0   amphetamine -dextroamphetamine  (ADDERALL) 10 MG tablet, Take 1 tablet (10 mg total) by mouth daily at 5 pm., Disp: 90 tablet, Rfl: 0   Bacillus Coagulans-Inulin (PROBIOTIC) 1-250 BILLION-MG CAPS, Take by mouth., Disp: , Rfl:    busPIRone  (BUSPAR ) 10 MG tablet, Take 1 tablet (10 mg total) by mouth daily., Disp: 90 tablet, Rfl: 0   busPIRone  (BUSPAR ) 10 MG tablet, Take 1 tablet (10 mg total) by mouth daily., Disp: 90 tablet, Rfl: 0   busPIRone  (BUSPAR ) 10 MG tablet, Take 1 tablet (10 mg total) by mouth daily., Disp: 90 tablet, Rfl: 0   busPIRone  (BUSPAR ) 10 MG tablet, Take 1 tablet (10 mg total) by mouth daily., Disp: 90 tablet, Rfl: 0   cabergoline  (DOSTINEX ) 0.5 MG tablet, Take 0.5 tablets (0.25 mg total) by mouth once a week., Disp: 6 tablet, Rfl: 4   cabergoline  (DOSTINEX ) 0.5 MG tablet, Take 0.5 tablets (0.25 mg total) by mouth 2 (two) times a week., Disp: 4 tablet, Rfl: 1   cabergoline  (DOSTINEX ) 0.5 MG tablet, Take 0.5 tablets (0.25 mg total) by mouth 2 (two) times a week., Disp: 12 tablet, Rfl: 4   cholecalciferol  (VITAMIN D3) 25 MCG (1000 UNIT) tablet, Take 1,000 Units by mouth daily., Disp: , Rfl:    clindamycin   (CLINDAGEL ) 1 % gel, Apply topically 2 (two) times daily., Disp: 60 g, Rfl: 0   Cyanocobalamin  (VITAMIN B-12) 5000 MCG SUBL, Take 2,500 mcg by mouth. Take 1 tablet 3 times a week, Disp: , Rfl:    emtricitabine -tenofovir  (TRUVADA ) 200-300 MG tablet, Take 1 tablet by mouth daily., Disp: 30 tablet, Rfl: 2   fluticasone  (FLONASE ) 50 MCG/ACT nasal spray, Place 2 sprays into both nostrils daily., Disp: 16 g, Rfl: 11   ketoconazole  (NIZORAL ) 2 % shampoo, Shampoo with liberally as directed three times a week Lather well, let sit 2-3 minutes then rinse., Disp: 120 mL, Rfl: 11   lisdexamfetamine  (VYVANSE ) 40 MG capsule, Take 1 capsule by mouth daily., Disp: 90 capsule, Rfl: 0   lisdexamfetamine  (VYVANSE ) 40 MG capsule, Take 1 capsule (40 mg total) by mouth daily., Disp: 90 capsule, Rfl: 0   lisdexamfetamine  (VYVANSE ) 40 MG  capsule, Take 1 capsule (40 mg total) by mouth daily., Disp: 90 capsule, Rfl: 0   lisdexamfetamine  (VYVANSE ) 40 MG capsule, Take 1 capsule (40 mg total) by mouth daily., Disp: 90 capsule, Rfl: 0   lisdexamfetamine  (VYVANSE ) 40 MG capsule, Take 1 capsule (40 mg total) by mouth daily., Disp: 90 capsule, Rfl: 0   lisdexamfetamine  (VYVANSE ) 40 MG capsule, Take 1 capsule (40 mg total) by mouth daily., Disp: 90 capsule, Rfl: 0   lisdexamfetamine  (VYVANSE ) 40 MG capsule, Take 1 capsule (40 mg total) by mouth daily., Disp: 90 capsule, Rfl: 0   magnesium oxide (MAG-OX) 400 (240 Mg) MG tablet, Take 400 mg by mouth daily., Disp: , Rfl:    Multiple Vitamin (MULTIVITAMIN WITH MINERALS) TABS tablet, Take 1 tablet by mouth daily., Disp: , Rfl:    ondansetron  (ZOFRAN -ODT) 4 MG disintegrating tablet, Dissolve 1 tablet (4 mg total) by mouth every 6 (six) hours as needed for nausea, Disp: 30 tablet, Rfl: 0   sodium chloride  (OCEAN) 0.65 % SOLN nasal spray, Place 1-2 sprays into both nostrils daily., Disp: 30 mL, Rfl: 12   triamcinolone  ointment (KENALOG ) 0.1 %, Apply to nails and cuticles every evening before  bed, Disp: 30 g, Rfl: 3   trimethoprim -polymyxin b  (POLYTRIM ) ophthalmic solution, Place 1 drop into the right eye in the morning, at noon, in the evening, and at bedtime. For 5 days, Disp: 10 mL, Rfl: 0  "

## 2025-01-28 ENCOUNTER — Ambulatory Visit: Payer: Self-pay | Admitting: Sports Medicine

## 2025-01-28 ENCOUNTER — Ambulatory Visit: Payer: Self-pay

## 2025-01-28 DIAGNOSIS — S73192D Other sprain of left hip, subsequent encounter: Secondary | ICD-10-CM

## 2025-01-28 DIAGNOSIS — G8929 Other chronic pain: Secondary | ICD-10-CM

## 2025-01-28 DIAGNOSIS — M25552 Pain in left hip: Secondary | ICD-10-CM

## 2025-02-17 ENCOUNTER — Encounter: Admitting: Nurse Practitioner

## 2025-03-14 ENCOUNTER — Ambulatory Visit

## 2025-11-29 ENCOUNTER — Encounter: Admitting: Nurse Practitioner
